# Patient Record
Sex: Male | Born: 1965 | Race: White | Hispanic: No | Marital: Single | Smoking: Current every day smoker
Health system: Southern US, Community
[De-identification: ages and names within clinical notes are randomized; demographics above are authoritative.]

## PROBLEM LIST (undated history)

## (undated) DIAGNOSIS — M609 Myositis, unspecified: Secondary | ICD-10-CM

## (undated) DIAGNOSIS — K7460 Unspecified cirrhosis of liver: Secondary | ICD-10-CM

## (undated) DIAGNOSIS — F319 Bipolar disorder, unspecified: Secondary | ICD-10-CM

## (undated) HISTORY — PX: FRACTURE SURGERY: SHX138

## (undated) HISTORY — DX: Myositis, unspecified: M60.9

## (undated) HISTORY — PX: HAND SURGERY: SHX662

---

## 2003-03-29 ENCOUNTER — Encounter: Payer: Self-pay | Admitting: *Deleted

## 2003-03-29 ENCOUNTER — Emergency Department (HOSPITAL_COMMUNITY): Admission: EM | Admit: 2003-03-29 | Discharge: 2003-03-29 | Payer: Self-pay | Admitting: *Deleted

## 2006-01-01 ENCOUNTER — Emergency Department (HOSPITAL_COMMUNITY): Admission: EM | Admit: 2006-01-01 | Discharge: 2006-01-01 | Payer: Self-pay | Admitting: Emergency Medicine

## 2009-03-03 ENCOUNTER — Emergency Department (HOSPITAL_COMMUNITY): Admission: EM | Admit: 2009-03-03 | Discharge: 2009-03-03 | Payer: Self-pay | Admitting: Emergency Medicine

## 2009-03-06 ENCOUNTER — Emergency Department (HOSPITAL_COMMUNITY): Admission: EM | Admit: 2009-03-06 | Discharge: 2009-03-06 | Payer: Self-pay | Admitting: Emergency Medicine

## 2011-02-26 LAB — BASIC METABOLIC PANEL
GFR calc Af Amer: >60 mL/min (ref >60)
GFR calc non Af Amer: >60 mL/min (ref >60)
Glucose, Bld: 88 mg/dL (ref 70–99)
Potassium: 3.3 mEq/L — ABNORMAL LOW (ref 3.5–5.1)
Sodium: 138 mEq/L (ref 135–145)

## 2011-02-26 LAB — CBC
HCT: 49.8 % (ref 39.0–52.0)
Hemoglobin: 17.5 g/dL — ABNORMAL HIGH (ref 13.0–17.0)
RBC: 5.06 MIL/uL (ref 4.22–5.81)
RDW: 13.7 % (ref 11.5–15.5)
WBC: 10 10*3/uL (ref 4.0–10.5)

## 2011-02-26 LAB — APTT: aPTT: 34 seconds (ref 24–37)

## 2012-08-13 ENCOUNTER — Emergency Department (HOSPITAL_COMMUNITY)
Admission: EM | Admit: 2012-08-13 | Discharge: 2012-08-14 | Disposition: A | Discharge status: Home / Self Care | Payer: Self-pay | Attending: Emergency Medicine | Admitting: Emergency Medicine

## 2012-08-13 ENCOUNTER — Encounter (HOSPITAL_COMMUNITY): Payer: Self-pay | Admitting: Emergency Medicine

## 2012-08-13 DIAGNOSIS — F1010 Alcohol abuse, uncomplicated: Secondary | ICD-10-CM | POA: Insufficient documentation

## 2012-08-13 HISTORY — DX: Unspecified cirrhosis of liver: K74.60

## 2012-08-13 LAB — CBC WITH DIFFERENTIAL/PLATELET
HCT: 50.9 % (ref 39.0–52.0)
Hemoglobin: 18 g/dL — ABNORMAL HIGH (ref 13.0–17.0)
Lymphocytes Relative: 27 % (ref 12–46)
Lymphs Abs: 2.1 10*3/uL (ref 0.7–4.0)
Monocytes Absolute: 0.6 10*3/uL (ref 0.1–1.0)
Monocytes Relative: 8 % (ref 3–12)
Neutro Abs: 5 10*3/uL (ref 1.7–7.7)
Neutrophils Relative %: 63 % (ref 43–77)
RBC: 5.2 MIL/uL (ref 4.22–5.81)
WBC: 7.9 10*3/uL (ref 4.0–10.5)

## 2012-08-13 LAB — COMPREHENSIVE METABOLIC PANEL
Alkaline Phosphatase: 62 U/L (ref 39–117)
BUN: 5 mg/dL — ABNORMAL LOW (ref 6–23)
CO2: 29 mEq/L (ref 19–32)
Chloride: 101 mEq/L (ref 96–112)
Creatinine, Ser: 0.45 mg/dL — ABNORMAL LOW (ref 0.50–1.35)
GFR calc non Af Amer: >90 mL/min (ref >90)
Potassium: 4.5 mEq/L (ref 3.5–5.1)
Total Bilirubin: 0.2 mg/dL — ABNORMAL LOW (ref 0.3–1.2)

## 2012-08-13 LAB — ETHANOL: Alcohol, Ethyl (B): 251 mg/dL — ABNORMAL HIGH (ref 0–11)

## 2012-08-13 NOTE — ED Notes (Signed)
Pt presents to ED via EMS stating he has alcohol poisoning and he "wants to be checked". Pt has hx of alcoholism and cirrhosis of the liver. Pt states he has drank 1 gallon of vodka today.

## 2012-08-13 NOTE — ED Provider Notes (Signed)
History   This chart was scribed for Joya Gaskins, MD by Toya Smothers. The patient was seen in room APA05/APA05. Patient's care was started at 2225.  CSN: [id number]  Arrival date & time 08/13/12  2225   First MD Initiated Contact with Patient 08/13/12 2256      Chief Complaint  Patient presents with  . Alcohol Problem   Patient is a 46 y.o. male presenting with alcohol problem. The history is provided by the patient. No language interpreter was used.  Alcohol Problem This is a chronic problem. The current episode started 6 to 12 hours ago. The problem occurs constantly. The problem has not changed since onset.Pertinent negatives include no chest pain, no abdominal pain, no headaches and no shortness of breath. The symptoms are aggravated by stress. The symptoms are relieved by drinking. He has tried water for the symptoms. The treatment provided no relief.    Bradley Mclaughlin is a 46 y.o. male with a h/o depression, chronic alcoholism, and bipolar disorder who presents to the Emergency Department complaining of severe constant alcohol problem. He endorses mild associated tremors and agitation. He has not eaten today. Pt reports drinking a gallon of vodka. Prior to arrival he drank water with no relief. Pt denies seizure, syncope, fever, chills, emesis, nausea, rash, and cough.   Past Medical History  Diagnosis Date  . Cirrhosis of liver     History reviewed. No pertinent past surgical history.  History reviewed. No pertinent family history.  History  Substance Use Topics  . Smoking status: Not on file  . Smokeless tobacco: Not on file  . Alcohol Use:    Review of Systems  Constitutional: Negative for fatigue.  HENT: Negative for congestion, sinus pressure and ear discharge.   Eyes: Negative for discharge.  Respiratory: Negative for cough and shortness of breath.   Cardiovascular: Negative for chest pain.  Gastrointestinal: Negative for vomiting, abdominal pain and  diarrhea.  Genitourinary: Negative for frequency and hematuria.  Musculoskeletal: Negative for back pain.  Skin: Negative for rash.  Neurological: Positive for tremors. Negative for seizures and headaches.  Hematological: Negative.   Psychiatric/Behavioral: Positive for agitation. Negative for hallucinations.       Alcohol problem    Allergies  Review of patient's allergies indicates not on file.  Home Medications  No current outpatient prescriptions on file.  BP 111/64  Pulse 101  Temp 97.6 F (36.4 C) (Oral)  Resp 18  SpO2 99%  Physical Exam CONSTITUTIONAL: Well developed/well nourished HEAD AND FACE: Normocephalic/atraumatic EYES: EOMI/PERRL ENMT: Mucous membranes moist NECK: supple no meningeal signs SPINE:entire spine nontender CV: S1/S2 noted, no murmurs/rubs/gallops noted LUNGS: Lungs are clear to auscultation bilaterally, no apparent distress ABDOMEN: soft, nontender, no rebound or guarding GU:no cva tenderness NEURO: Pt is awake/alert, moves all extremitiesx4.  No tremor is noted EXTREMITIES: pulses normal, full ROM SKIN: warm, color normal PSYCH: no abnormalities of mood noted  ED Course  Procedures  DIAGNOSTIC STUDIES: Oxygen Saturation is 99% on room air, normal by my interpretation.    COORDINATION OF CARE: 23:06- Evaluated Pt. Pt is awake, alert, and oriented.  No distress noted.  He is mostly concerned about restarting his lithium.  He reports he has not had it in years.  I advised him to f/u with [facility] as outpatient.  He is in no distress, watching TV and eating a meal.  Safe for d/c home, he called for ride home   Labs Reviewed  CBC WITH DIFFERENTIAL -  Abnormal; Notable for the following:    Hemoglobin 18.0 (*)     MCH 34.6 (*)     All other components within normal limits  COMPREHENSIVE METABOLIC PANEL - Abnormal; Notable for the following:    Glucose, Bld 100 (*)     BUN 5 (*)     Creatinine, Ser 0.45 (*)     Total Protein 8.6 (*)     AST  124 (*)     ALT 100 (*)     Total Bilirubin 0.2 (*)     All other components within normal limits  ETHANOL - Abnormal; Notable for the following:    Alcohol, Ethyl (B) 251 (*)     All other components within normal limits     1. Alcohol abuse       MDM  Nursing notes including past medical history and social history reviewed and considered in documentation Labs/vital reviewed and considered      I personally performed the services described in this documentation, which was scribed in my presence. The recorded information has been reviewed and considered.         Joya Gaskins, MD 08/14/12 [phone]

## 2013-09-23 ENCOUNTER — Encounter (HOSPITAL_COMMUNITY): Payer: Self-pay | Admitting: Emergency Medicine

## 2013-09-23 ENCOUNTER — Emergency Department (HOSPITAL_COMMUNITY)
Admission: EM | Admit: 2013-09-23 | Discharge: 2013-09-23 | Disposition: A | Discharge status: Home / Self Care | Payer: Self-pay | Attending: Emergency Medicine | Admitting: Emergency Medicine

## 2013-09-23 DIAGNOSIS — L509 Urticaria, unspecified: Secondary | ICD-10-CM | POA: Insufficient documentation

## 2013-09-23 DIAGNOSIS — F17200 Nicotine dependence, unspecified, uncomplicated: Secondary | ICD-10-CM | POA: Insufficient documentation

## 2013-09-23 DIAGNOSIS — Z8719 Personal history of other diseases of the digestive system: Secondary | ICD-10-CM | POA: Insufficient documentation

## 2013-09-23 MED ORDER — METHYLPREDNISOLONE SODIUM SUCC 125 MG IJ SOLR
125.0000 mg | Freq: Once | INTRAMUSCULAR | Status: AC
  Administered 2013-09-23: 125 mg via INTRAVENOUS
  Filled 2013-09-23: qty 2

## 2013-09-23 MED ORDER — PREDNISONE 20 MG PO TABS: 60.0000 mg | ORAL_TABLET | Freq: Every day | ORAL | Status: DC

## 2013-09-23 MED ORDER — FAMOTIDINE IN NACL 20-0.9 MG/50ML-% IV SOLN
20.0000 mg | Freq: Once | INTRAVENOUS | Status: AC
  Administered 2013-09-23: 20 mg via INTRAVENOUS
  Filled 2013-09-23: qty 50

## 2013-09-23 NOTE — ED Notes (Signed)
Patient reports started breaking out tonight all over body and itching. Denies using anything out of the ordinary or consuming anything out of the ordinary. He states he drinks regularly and has drank approximately 7 beers today. EMS administered 50 mg of Benadryl prior to patient's arrival.

## 2013-09-23 NOTE — ED Provider Notes (Signed)
CSN: [id number]     Arrival date & time 09/23/13  0148 History   First MD Initiated Contact with Patient 09/23/13 0153     Chief Complaint  Patient presents with  . Allergic Reaction    woke up with rash and itching   (Consider location/radiation/quality/duration/timing/severity/associated sxs/prior Treatment) Patient is a 47 y.o. male presenting with allergic reaction. The history is provided by the patient.  Allergic Reaction He noted onset this evening of itching across his lower back. He initially was breaking out in welts and that they reaction seemed to be spreading. He denies any difficulty breathing or swallowing. He tried applying Clorox and take a shower, neither of which helped. If any new medications, foods, soaps, laundry detergents. He does not recall having had reactions like this before. Of note, he admits to smoking 2.5 packs of cigarettes a day and drinking between 6 and 12 beers a day. He came in by ambulance and EMS did give her 50 mg of Benadryl which has helped with the itching.  Past Medical History  Diagnosis Date  . Cirrhosis of liver    No past surgical history on file. No family history on file. History  Substance Use Topics  . Smoking status: Current Every Day Smoker  . Smokeless tobacco: Not on file  . Alcohol Use: 4.2 oz/week    7 Cans of beer per week    Review of Systems  All other systems reviewed and are negative.    Allergies  Review of patient's allergies indicates not on file.  Home Medications  No current outpatient prescriptions on file. BP 123/87  Pulse 109  Temp(Src) 97.9 F (36.6 C) (Oral)  Resp 20  Ht 5\' 7"  (1.702 m)  Wt 111 lb (50.349 kg)  BMI 17.38 kg/m2  SpO2 96% Physical Exam  Nursing note and vitals reviewed.  47 year old male, resting comfortably and in no acute distress. Vital signs are significant for tachycardia with heart rate 109. Oxygen saturation is 96%, which is normal. Head is normocephalic and atraumatic.  PERRLA, EOMI. Oropharynx is clear. There is no edema of the uvula or soft palate or pharynx. He has no difficulty with secretions, and phonation is normal. Neck is nontender and supple without adenopathy or JVD. Back is nontender and there is no CVA tenderness. Lungs are clear without rales, wheezes, or rhonchi. Chest is nontender. Heart has regular rate and rhythm without murmur. Abdomen is soft, flat, nontender without masses or hepatosplenomegaly and peristalsis is normoactive. Extremities have no cyanosis or edema, full range of motion is present. Skin is warm and dry. Urticarial rash is present over the trunk and legs with areas of confluence. Neurologic: Mental status is normal, cranial nerves are intact, there are no motor or sensory deficits.  ED Course  Procedures (including critical care time)  MDM   1. Urticaria    Urticaria of uncertain cause. He is already received diphenhydramine. He'll be given famotidine and methylprednisolone and observed. Review of old records shows no prior ED visits for urticaria.  After above-noted treatment, rash faded dramatically. He is discharged with prescription for prednisone and is advised to use over-the-counter loratadine and famotidine. He sees diphenhydramine for itching that is not controlled by the above noted regimen.  Dione Booze, MD 09/23/13 [phone]

## 2015-03-07 ENCOUNTER — Emergency Department (HOSPITAL_COMMUNITY)
Admission: EM | Admit: 2015-03-07 | Discharge: 2015-03-07 | Disposition: A | Discharge status: Home / Self Care | Payer: Self-pay | Attending: Emergency Medicine | Admitting: Emergency Medicine

## 2015-03-07 ENCOUNTER — Encounter (HOSPITAL_COMMUNITY): Payer: Self-pay | Admitting: *Deleted

## 2015-03-07 DIAGNOSIS — K921 Melena: Secondary | ICD-10-CM

## 2015-03-07 DIAGNOSIS — R5383 Other fatigue: Secondary | ICD-10-CM | POA: Insufficient documentation

## 2015-03-07 DIAGNOSIS — Z8659 Personal history of other mental and behavioral disorders: Secondary | ICD-10-CM | POA: Insufficient documentation

## 2015-03-07 DIAGNOSIS — Z720 Tobacco use: Secondary | ICD-10-CM | POA: Insufficient documentation

## 2015-03-07 HISTORY — DX: Bipolar disorder, unspecified: F31.9

## 2015-03-07 LAB — CBC WITH DIFFERENTIAL/PLATELET
BASOS ABS: 0 10*3/uL (ref 0.0–0.1)
Basophils Relative: 0 % (ref 0–1)
Eosinophils Absolute: 0.2 10*3/uL (ref 0.0–0.7)
Eosinophils Relative: 3 % (ref 0–5)
HEMATOCRIT: 54.8 % — AB (ref 39.0–52.0)
HEMOGLOBIN: 19.7 g/dL — AB (ref 13.0–17.0)
LYMPHS PCT: 31 % (ref 12–46)
Lymphs Abs: 2.5 10*3/uL (ref 0.7–4.0)
MCH: 35.9 pg — ABNORMAL HIGH (ref 26.0–34.0)
MCHC: 35.9 g/dL (ref 30.0–36.0)
MCV: 99.8 fL (ref 78.0–100.0)
MONO ABS: 0.4 10*3/uL (ref 0.1–1.0)
MONOS PCT: 6 % (ref 3–12)
NEUTROS ABS: 4.8 10*3/uL (ref 1.7–7.7)
NEUTROS PCT: 60 % (ref 43–77)
Platelets: 225 10*3/uL (ref 150–400)
RBC: 5.49 MIL/uL (ref 4.22–5.81)
RDW: 13.4 % (ref 11.5–15.5)
WBC: 8 10*3/uL (ref 4.0–10.5)

## 2015-03-07 LAB — COMPREHENSIVE METABOLIC PANEL
ALBUMIN: 5 g/dL (ref 3.5–5.2)
ALK PHOS: 59 U/L (ref 39–117)
ALT: 90 U/L — ABNORMAL HIGH (ref 0–53)
AST: 103 U/L — AB (ref 0–37)
Anion gap: 10 (ref 5–15)
BILIRUBIN TOTAL: 0.5 mg/dL (ref 0.3–1.2)
BUN: 5 mg/dL — ABNORMAL LOW (ref 6–23)
CHLORIDE: 104 mmol/L (ref 96–112)
CO2: 26 mmol/L (ref 19–32)
Calcium: 9.5 mg/dL (ref 8.4–10.5)
Creatinine, Ser: 0.5 mg/dL (ref 0.50–1.35)
GFR calc Af Amer: >90 mL/min (ref >90)
Glucose, Bld: 97 mg/dL (ref 70–99)
POTASSIUM: 4.5 mmol/L (ref 3.5–5.1)
Sodium: 140 mmol/L (ref 135–145)
Total Protein: 9.2 g/dL — ABNORMAL HIGH (ref 6.0–8.3)

## 2015-03-07 LAB — POC OCCULT BLOOD, ED: FECAL OCCULT BLD: NEGATIVE

## 2015-03-07 NOTE — Discharge Instructions (Signed)
Bloody Stools °Bloody stools means there is blood in your poop (stool). It is a sign that there is a problem somewhere in the digestive system. It is important for your doctor to find the cause of your bleeding, so the problem can be treated.  °HOME CARE °· Only take medicine as told by your doctor. °· Eat foods with fiber (prunes, bran cereals). °· Drink enough fluids to keep your pee (urine) clear or pale yellow. °· Sit in warm water (sitz bath) for 10 to 15 minutes as told by your doctor. °· Know how to take your medicines (enemas, suppositories) if advised by your doctor. °· Watch for signs that you are getting better or getting worse. °GET HELP RIGHT AWAY IF:  °· You are not getting better. °· You start to get better but then get worse again. °· You have new problems. °· You have severe bleeding from the place where poop comes out (rectum) that does not stop. °· You throw up (vomit) blood. °· You feel weak or pass out (faint). °· You have a fever. °MAKE SURE YOU:  °· Understand these instructions. °· Will watch your condition. °· Will get help right away if you are not doing well or get worse. °Document Released: 10/22/2009 Document Revised: 01/26/2012 Document Reviewed: 03/21/2011 °[employer]® Patient Information ©2015 [employer]. This information is not intended to replace advice given to you by your health care provider. Make sure you discuss any questions you have with your health care provider. ° °

## 2015-03-07 NOTE — ED Provider Notes (Signed)
CSN: [id number]     Arrival date & time 03/07/15  0020 History   First MD Initiated Contact with Patient 03/07/15 0227     No chief complaint on file.    (Consider location/radiation/quality/duration/timing/severity/associated sxs/prior Treatment) HPI  This is a 49 year old male with history of cirrhosis of the liver, alcohol abuse who presents with bloody stools. Patient reports 2-3 month history of bloody stools. He states that it has gotten worse. He now states "it's a daily thing." He reports that there is "a lot of blood." He states that it covers the toilet. He reports generalized fatigue but no lightheadedness or dizziness. Denies any vomiting or abdominal pain. Denies any rectal pain. Has never had anything like this in the past.  Past Medical History  Diagnosis Date  . Cirrhosis of liver   . Bipolar depression    Past Surgical History  Procedure Laterality Date  . Fracture surgery      femur   No family history on file. History  Substance Use Topics  . Smoking status: Current Every Day Smoker -- 2.00 packs/day  . Smokeless tobacco: Not on file  . Alcohol Use: 4.2 oz/week    7 Cans of beer per week    Review of Systems  Constitutional: Positive for fatigue. Negative for fever.  Respiratory: Negative.  Negative for chest tightness and shortness of breath.   Cardiovascular: Negative.  Negative for chest pain.  Gastrointestinal: Positive for blood in stool. Negative for nausea, vomiting, abdominal pain, diarrhea and constipation.  Genitourinary: Negative.  Negative for dysuria.  Musculoskeletal: Negative for back pain.  Neurological: Negative for dizziness, light-headedness and headaches.  All other systems reviewed and are negative.     Allergies  Review of patient's allergies indicates no active allergies.  Home Medications   Prior to Admission medications   Medication Sig Start Date End Date Taking? Authorizing Provider  loratadine (CLARITIN) 10 MG tablet  Take 10 mg by mouth daily.   Yes Historical Provider, MD   BP 116/89 mmHg  Pulse 85  Temp(Src) 97.7 F (36.5 C) (Oral)  Resp 16  Ht  (1.702 m)  Wt 109 lb (49.442 kg)  BMI 17.07 kg/m2  SpO2 99% Physical Exam  Constitutional: He is oriented to person, place, and time. No distress.  Thin  HENT:  Head: Normocephalic and atraumatic.  Cardiovascular: Normal rate, regular rhythm and normal heart sounds.   No murmur heard. Pulmonary/Chest: Effort normal and breath sounds normal. No respiratory distress. He has no wheezes.  Abdominal: Soft. Bowel sounds are normal. There is no tenderness. There is no rebound and no guarding.  Genitourinary: Rectum normal.  No gross blood  Musculoskeletal: He exhibits no edema.  Neurological: He is alert and oriented to person, place, and time.  Skin: Skin is warm and dry.  Psychiatric: He has a normal mood and affect.  Nursing note and vitals reviewed.   ED Course  Procedures (including critical care time) Labs Review Labs Reviewed  CBC WITH DIFFERENTIAL/PLATELET - Abnormal; Notable for the following:    Hemoglobin 19.7 (*)    HCT 54.8 (*)    MCH 35.9 (*)    All other components within normal limits  COMPREHENSIVE METABOLIC PANEL - Abnormal; Notable for the following:    BUN 5 (*)    Total Protein 9.2 (*)    AST 103 (*)    ALT 90 (*)    All other components within normal limits  POC OCCULT BLOOD, ED  Imaging Review No results found.   EKG Interpretation None      MDM   Final diagnoses:  Bloody stools    Patient presents with complaints of bloody bowel movements. Nontoxic on exam. Vital signs reassuring. No gross blood on rectal examination Hemoccult at the bedside is negative. Patient denies any other symptoms.  Hemoglobin is 19.7. Otherwise lab work notable for mildly elevated AST and ALT which is likely reflective of the patient's known liver disease. No evidence of active GI bleeding at this time. Discuss workup with the  patient. Patient given the number for GI follow-up. Patient stated understanding. He was given strict return precautions.  After history, exam, and medical workup I feel the patient has been appropriately medically screened and is safe for discharge home. Pertinent diagnoses were discussed with the patient. Patient was given return precautions.     Shon Batonourtney F Aryon Nham, MD 03/07/15 [phone]

## 2015-03-07 NOTE — ED Notes (Signed)
MD at bedside. 

## 2015-03-07 NOTE — ED Notes (Signed)
Pt states for the last several months he has been passing blood that has gotten worse the last couple of days. Pt states he

## 2015-08-02 ENCOUNTER — Encounter (HOSPITAL_COMMUNITY): Payer: Self-pay | Admitting: Emergency Medicine

## 2015-08-02 ENCOUNTER — Emergency Department (HOSPITAL_COMMUNITY): Payer: Self-pay

## 2015-08-02 ENCOUNTER — Emergency Department (HOSPITAL_COMMUNITY)
Admission: EM | Admit: 2015-08-02 | Discharge: 2015-08-02 | Disposition: A | Discharge status: Home / Self Care | Payer: Self-pay | Attending: Emergency Medicine | Admitting: Emergency Medicine

## 2015-08-02 DIAGNOSIS — Z720 Tobacco use: Secondary | ICD-10-CM | POA: Insufficient documentation

## 2015-08-02 DIAGNOSIS — G8929 Other chronic pain: Secondary | ICD-10-CM | POA: Insufficient documentation

## 2015-08-02 DIAGNOSIS — Y998 Other external cause status: Secondary | ICD-10-CM | POA: Insufficient documentation

## 2015-08-02 DIAGNOSIS — Y929 Unspecified place or not applicable: Secondary | ICD-10-CM | POA: Insufficient documentation

## 2015-08-02 DIAGNOSIS — S79911A Unspecified injury of right hip, initial encounter: Secondary | ICD-10-CM | POA: Insufficient documentation

## 2015-08-02 DIAGNOSIS — Y939 Activity, unspecified: Secondary | ICD-10-CM | POA: Insufficient documentation

## 2015-08-02 DIAGNOSIS — R64 Cachexia: Secondary | ICD-10-CM | POA: Insufficient documentation

## 2015-08-02 DIAGNOSIS — M25551 Pain in right hip: Secondary | ICD-10-CM

## 2015-08-02 DIAGNOSIS — Z79899 Other long term (current) drug therapy: Secondary | ICD-10-CM | POA: Insufficient documentation

## 2015-08-02 DIAGNOSIS — W19XXXA Unspecified fall, initial encounter: Secondary | ICD-10-CM | POA: Insufficient documentation

## 2015-08-02 DIAGNOSIS — Z8659 Personal history of other mental and behavioral disorders: Secondary | ICD-10-CM | POA: Insufficient documentation

## 2015-08-02 DIAGNOSIS — Z8719 Personal history of other diseases of the digestive system: Secondary | ICD-10-CM | POA: Insufficient documentation

## 2015-08-02 MED ORDER — NAPROXEN 500 MG PO TABS: 500.0000 mg | ORAL_TABLET | Freq: Two times a day (BID) | ORAL | Status: DC

## 2015-08-02 MED ORDER — CYCLOBENZAPRINE HCL 10 MG PO TABS
5.0000 mg | ORAL_TABLET | Freq: Once | ORAL | Status: AC
  Administered 2015-08-02: 5 mg via ORAL
  Filled 2015-08-02: qty 1

## 2015-08-02 NOTE — Discharge Instructions (Signed)
You were seen today for pain in your right hip. Your pain is chronic and your x-rays are reassuring. You need to follow-up with your orthopedist who did your surgery, Dr. Hilda Lias.

## 2015-08-02 NOTE — ED Notes (Signed)
Pt c/o rt hip pain from fall 3 weeks ago. Pt has been drinking and smoking thc.

## 2015-08-02 NOTE — ED Notes (Signed)
Patient given discharge instruction, verbalized understand. Patient ambulatory out of the department.  

## 2015-08-02 NOTE — ED Provider Notes (Signed)
CSN: [id number]     Arrival date & time 08/02/15  0050 History   First MD Initiated Contact with Patient 08/02/15 0120     Chief Complaint  Patient presents with  . Hip Pain     (Consider location/radiation/quality/duration/timing/severity/associated sxs/prior Treatment) HPI  This is a 49 year old male with history of cirrhosis of the liver and alcoholism who presents with right hip pain. Patient reports history of right hip fracture requiring surgery. He states that 3 weeks ago he fell onto his right hip. Since that time he has had increasing pain. He has not taken any medications for the pain. He does self medicate with alcohol and marijuana. He drinks daily. He denies any other injury. Currently his pain is 7 out of 10. He states "I just want to know why I don't walk right."  He states that since his hip fracture he has not had a normal gait. Denies any weakness, numbness, or tingling of the lower extremity  Past Medical History  Diagnosis Date  . Cirrhosis of liver   . Bipolar depression    Past Surgical History  Procedure Laterality Date  . Fracture surgery      femur   History reviewed. No pertinent family history. Social History  Substance Use Topics  . Smoking status: Current Every Day Smoker -- 2.00 packs/day  . Smokeless tobacco: None  . Alcohol Use: 4.2 oz/week    7 Cans of beer per week    Review of Systems  Musculoskeletal: Positive for gait problem.       Right hip pain  Neurological: Negative for weakness and numbness.  All other systems reviewed and are negative.     Allergies  Review of patient's allergies indicates no active allergies.  Home Medications   Prior to Admission medications   Medication Sig Start Date End Date Taking? Authorizing Provider  loratadine (CLARITIN) 10 MG tablet Take 10 mg by mouth daily.    Historical Provider, MD  naproxen (NAPROSYN) 500 MG tablet Take 1 tablet (500 mg total) by mouth 2 (two) times daily. 08/02/15   Shon Baton, MD   BP 123/89 mmHg  Pulse 105  Temp(Src) 98.1 F (36.7 C)  Resp 18  Ht 5\' 7"  (1.702 m)  Wt 112 lb (50.803 kg)  BMI 17.54 kg/m2  SpO2 96% Physical Exam  Constitutional: He is oriented to person, place, and time.  Cachectic, chronically ill-appearing  HENT:  Head: Normocephalic and atraumatic.  Cardiovascular: Normal rate and regular rhythm.   Pulmonary/Chest: Effort normal.  Musculoskeletal: Normal range of motion. He exhibits no edema.  Normal range of motion of the right hip, no obvious contusion or abrasion, no obvious deformities, 2+ DP pulses  Neurological: He is alert and oriented to person, place, and time.  Skin: Skin is warm and dry.  Psychiatric: He has a normal mood and affect.  Nursing note and vitals reviewed.   ED Course  Procedures (including critical care time) Labs Review Labs Reviewed - No data to display  Imaging Review Dg Hip Unilat With Pelvis 2-3 Views Right  08/02/2015   CLINICAL DATA:  49 year old male with hip pain. History of prior right hip surgery.  EXAM: DG HIP (WITH OR WITHOUT PELVIS) 2-3V RIGHT  COMPARISON:  None.  FINDINGS: No acute fracture or dislocation. Minimal bowed appearance of the proximal right femoral diaphysis, likely related to old healed fracture. Small corticated appearing bony fragment noted adjacent to the greater trochanter of the right femur, likely related to  an old fracture. The soft tissues are grossly unremarkable.  IMPRESSION: No acute fracture or dislocation.   Electronically Signed   By: Elgie Collard M.D.   On: 08/02/2015 02:15   I have personally reviewed and evaluated these images and lab results as part of my medical decision-making.   EKG Interpretation None      MDM   Final diagnoses:  Chronic hip pain, right    Patient presents with right hip pain. History of the same. Does report a fall 3 weeks ago. Has been ambulatory. Otherwise nontoxic. Patient is declining pain medication and states "I  just want to know why I can't walk right." Plain films are negative. Discussed with patient at length following up with his orthopedist. He states that Dr. Hilda Lias did his original surgery. I do not see anything in his chart indicating this surgery; however have encouraged him to follow-up. He is requesting muscle relaxers. I discussed with him that I did not feel this was appropriate as he drink alcohol daily and is a fall risk. He can take naproxen twice a day for pain.  After history, exam, and medical workup I feel the patient has been appropriately medically screened and is safe for discharge home. Pertinent diagnoses were discussed with the patient. Patient was given return precautions.     Shon Baton, MD 08/02/15 [phone]

## 2016-11-29 ENCOUNTER — Encounter (HOSPITAL_COMMUNITY): Payer: Self-pay | Admitting: Emergency Medicine

## 2016-11-29 ENCOUNTER — Emergency Department (HOSPITAL_COMMUNITY): Payer: Self-pay

## 2016-11-29 ENCOUNTER — Emergency Department (HOSPITAL_COMMUNITY)
Admission: EM | Admit: 2016-11-29 | Discharge: 2016-11-29 | Disposition: A | Discharge status: Home / Self Care | Payer: Self-pay | Attending: Emergency Medicine | Admitting: Emergency Medicine

## 2016-11-29 DIAGNOSIS — Z79899 Other long term (current) drug therapy: Secondary | ICD-10-CM | POA: Insufficient documentation

## 2016-11-29 DIAGNOSIS — T22111A Burn of first degree of right forearm, initial encounter: Secondary | ICD-10-CM | POA: Insufficient documentation

## 2016-11-29 DIAGNOSIS — T300 Burn of unspecified body region, unspecified degree: Secondary | ICD-10-CM

## 2016-11-29 DIAGNOSIS — J441 Chronic obstructive pulmonary disease with (acute) exacerbation: Secondary | ICD-10-CM | POA: Insufficient documentation

## 2016-11-29 DIAGNOSIS — Y929 Unspecified place or not applicable: Secondary | ICD-10-CM | POA: Insufficient documentation

## 2016-11-29 DIAGNOSIS — Y9389 Activity, other specified: Secondary | ICD-10-CM | POA: Insufficient documentation

## 2016-11-29 DIAGNOSIS — F17200 Nicotine dependence, unspecified, uncomplicated: Secondary | ICD-10-CM | POA: Insufficient documentation

## 2016-11-29 DIAGNOSIS — Y999 Unspecified external cause status: Secondary | ICD-10-CM | POA: Insufficient documentation

## 2016-11-29 DIAGNOSIS — X158XXA Contact with other hot household appliances, initial encounter: Secondary | ICD-10-CM | POA: Insufficient documentation

## 2016-11-29 MED ORDER — ALBUTEROL SULFATE HFA 108 (90 BASE) MCG/ACT IN AERS
2.0000 | INHALATION_SPRAY | Freq: Four times a day (QID) | RESPIRATORY_TRACT | Status: DC | PRN
Start: 2016-11-29 — End: 2016-11-29
  Administered 2016-11-29: 2 via RESPIRATORY_TRACT
  Filled 2016-11-29: qty 6.7

## 2016-11-29 MED ORDER — SILVER SULFADIAZINE 1 % EX CREA
TOPICAL_CREAM | Freq: Once | CUTANEOUS | Status: DC
  Filled 2016-11-29: qty 50

## 2016-11-29 MED ORDER — IPRATROPIUM BROMIDE 0.02 % IN SOLN
0.5000 mg | Freq: Once | RESPIRATORY_TRACT | Status: AC
  Administered 2016-11-29: 0.5 mg via RESPIRATORY_TRACT
  Filled 2016-11-29: qty 2.5

## 2016-11-29 MED ORDER — AZITHROMYCIN 250 MG PO TABS: ORAL_TABLET | ORAL | 0 refills | Status: DC

## 2016-11-29 MED ORDER — ALBUTEROL SULFATE (2.5 MG/3ML) 0.083% IN NEBU
5.0000 mg | INHALATION_SOLUTION | Freq: Once | RESPIRATORY_TRACT | Status: AC
  Administered 2016-11-29: 5 mg via RESPIRATORY_TRACT
  Filled 2016-11-29: qty 6

## 2016-11-29 MED ORDER — AEROCHAMBER Z-STAT PLUS/MEDIUM MISC
1.0000 | Freq: Once | Status: DC
  Filled 2016-11-29: qty 1

## 2016-11-29 MED ORDER — PREDNISONE 20 MG PO TABS: ORAL_TABLET | ORAL | 0 refills | Status: DC

## 2016-11-29 NOTE — Discharge Instructions (Signed)
Use the inhaler 2 puffs every 6 hours for wheezing and shortness of breath. Take the prednisone and antibiotic until gone. Try to quit smoking. You already have changes of emphysema or COPD. Recheck if you get worse, such as fever, struggling to breathe. You can take mucinex DM for cough.

## 2016-11-29 NOTE — ED Triage Notes (Signed)
Pt C/O cough X 2 weeks. Pt states he has "taken 3 boxes of cold medicine over 2 weeks with no relief."
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
## 2020-07-05 ENCOUNTER — Ambulatory Visit: Payer: Self-pay | Attending: Internal Medicine

## 2020-07-05 DIAGNOSIS — Z23 Encounter for immunization: Secondary | ICD-10-CM

## 2020-07-05 NOTE — Progress Notes (Signed)
   Covid-19 Vaccination Clinic  Name:  Bradley Mclaughlin    MRN: [id number] DOB: [birth year]  07/05/2020  Mr. Bradley Mclaughlin was observed post Covid-19 immunization for 15 minutes without incident. He was provided with Vaccine Information Sheet and instruction to access the V-Safe system.   Mr. Bradley Mclaughlin was instructed to call 911 with any severe reactions post vaccine: Marland Kitchen Difficulty breathing  . Swelling of face and throat  . A fast heartbeat  . A bad rash all over body  . Dizziness and weakness   Immunizations Administered    Name Date Dose VIS Date Route   Pfizer COVID-19 Vaccine 07/05/2020  9:12 AM 0.3 mL 01/11/2019 Intramuscular   Manufacturer: [employer], [employer]   Lot: [id number]   NDC: [address]-7

## 2020-07-26 ENCOUNTER — Ambulatory Visit: Payer: Self-pay | Attending: Internal Medicine

## 2020-07-26 DIAGNOSIS — Z23 Encounter for immunization: Secondary | ICD-10-CM

## 2020-07-26 NOTE — Progress Notes (Signed)
   Covid-19 Vaccination Clinic  Name:  Bradley Mclaughlin    MRN: [id number] DOB: 06/18/1966  07/26/2020  Mr. Bradley Mclaughlin was observed post Covid-19 immunization for 15 minutes without incident. He was provided with Vaccine Information Sheet and instruction to access the V-Safe system.   Mr. Bradley Mclaughlin was instructed to call 911 with any severe reactions post vaccine: Marland Kitchen Difficulty breathing  . Swelling of face and throat  . A fast heartbeat  . A bad rash all over body  . Dizziness and weakness   Immunizations Administered    Name Date Dose VIS Date Route   Pfizer COVID-19 Vaccine 07/26/2020  9:23 AM 0.3 mL 01/11/2019 Intramuscular   Manufacturer: [employer], [employer]   Lot: [id number]   NDC: [address]-4

## 2021-02-21 ENCOUNTER — Telehealth: Payer: Self-pay

## 2021-02-21 NOTE — Telephone Encounter (Signed)
New [facility] client. Attempted to call to assist client in setting up [facility] appointment [facility] to [facility] care. No answer. Left message.   Francee Nodal RN [employer]

## 2021-02-21 NOTE — Telephone Encounter (Signed)
Client new to [facility]: returned call.  Assisted in setting up appointment to establish [facility]  Date 03/05/21 time 1PM  Client given contact information.  Client reports he will need transportation. [facility] arranged today for 03/05/21 for [facility] appointment. Pick up address [address], [place], [place] [address].  Client wishes to leave [place] Dad address as primary mailing address.  Client notified of when [facility] will contact client to confirm pick up time and location on 03/04/21. [facility] [employer] will obtain transportation [employer].  Will plan follow up after first appointment.  Francee Nodal RN [employer]

## 2021-03-05 ENCOUNTER — Ambulatory Visit: Payer: Self-pay | Admitting: Physician Assistant

## 2021-03-05 ENCOUNTER — Encounter: Payer: Self-pay | Admitting: Physician Assistant

## 2021-03-05 VITALS — BP 128/77 | HR 79 | Temp 97.0°F | Wt 114.0 lb

## 2021-03-05 DIAGNOSIS — F17200 Nicotine dependence, unspecified, uncomplicated: Secondary | ICD-10-CM

## 2021-03-05 DIAGNOSIS — Z9889 Other specified postprocedural states: Secondary | ICD-10-CM

## 2021-03-05 DIAGNOSIS — Z7689 Persons encountering health services in other specified circumstances: Secondary | ICD-10-CM

## 2021-03-05 DIAGNOSIS — Z13220 Encounter for screening for lipoid disorders: Secondary | ICD-10-CM

## 2021-03-05 DIAGNOSIS — R7989 Other specified abnormal findings of blood chemistry: Secondary | ICD-10-CM

## 2021-03-05 DIAGNOSIS — M25551 Pain in right hip: Secondary | ICD-10-CM

## 2021-03-05 DIAGNOSIS — Z125 Encounter for screening for malignant neoplasm of prostate: Secondary | ICD-10-CM

## 2021-03-05 DIAGNOSIS — Z1211 Encounter for screening for malignant neoplasm of colon: Secondary | ICD-10-CM

## 2021-03-05 DIAGNOSIS — F1010 Alcohol abuse, uncomplicated: Secondary | ICD-10-CM
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
## 2021-03-05 NOTE — Patient Instructions (Signed)
  Xray right hip  Refer to orthopedics Financial assistance application Labs AA [facility] covid booster Colon cancer screening test

## 2021-03-06 ENCOUNTER — Other Ambulatory Visit: Payer: Self-pay | Admitting: Physician Assistant

## 2021-03-06 ENCOUNTER — Telehealth: Payer: Self-pay

## 2021-03-06 DIAGNOSIS — Z1211 Encounter for screening for malignant neoplasm of colon: Secondary | ICD-10-CM

## 2021-03-06 NOTE — Telephone Encounter (Signed)
Called to follow up with Mr Cuevas after his [facility] appointment [facility]. Client reports appointment went well. Provider referring client to orthopedics for hip and back pain. He is also ordered, x-rays and labs at [facility]. * arranged [facility] [employer] for 03/08/21 at 0830 to get labs drawn and x-ray performed.  * arranged [facility] for [facility] in intake at [facility] for [facility] health services to be established for 03/12/21 at 0800.  * Arranged [facility] for follow up visit to [facility] for 04/04/21 at 0815.  Discussed CAFA application will discuss with Abran Duke, client has application to fill out, but will need docs and notarized letter.  Discussed housing options and will begin seeking options for client. Client understands housing options are a process and may take some time.  Will follow as needed.  Francee Nodal RN [employer]

## 2021-03-08 ENCOUNTER — Ambulatory Visit (HOSPITAL_COMMUNITY)
Admission: RE | Admit: 2021-03-08 | Discharge: 2021-03-08 | Disposition: A | Discharge status: Home / Self Care | Payer: Self-pay | Source: Ambulatory Visit | Attending: Physician Assistant | Admitting: Physician Assistant

## 2021-03-08 ENCOUNTER — Other Ambulatory Visit: Payer: Self-pay | Admitting: Physician Assistant

## 2021-03-08 ENCOUNTER — Other Ambulatory Visit (HOSPITAL_COMMUNITY)
Admission: RE | Admit: 2021-03-08 | Discharge: 2021-03-08 | Disposition: A | Discharge status: Home / Self Care | Payer: Self-pay | Source: Ambulatory Visit | Attending: Physician Assistant | Admitting: Physician Assistant

## 2021-03-08 ENCOUNTER — Other Ambulatory Visit: Payer: Self-pay

## 2021-03-08 DIAGNOSIS — F1010 Alcohol abuse, uncomplicated: Secondary | ICD-10-CM | POA: Insufficient documentation

## 2021-03-08 DIAGNOSIS — R7989 Other specified abnormal findings of blood chemistry: Secondary | ICD-10-CM | POA: Insufficient documentation

## 2021-03-08 DIAGNOSIS — Z9889 Other specified postprocedural states: Secondary | ICD-10-CM | POA: Insufficient documentation

## 2021-03-08 DIAGNOSIS — Z13220 Encounter for screening for lipoid disorders: Secondary | ICD-10-CM | POA: Insufficient documentation

## 2021-03-08 DIAGNOSIS — M25551 Pain in right hip: Secondary | ICD-10-CM

## 2021-03-08 DIAGNOSIS — Z125 Encounter for screening for malignant neoplasm of prostate: Secondary | ICD-10-CM | POA: Insufficient documentation

## 2021-03-08 LAB — COMPREHENSIVE METABOLIC PANEL
ALT: 96 U/L — ABNORMAL HIGH (ref 0–44)
AST: 148 U/L — ABNORMAL HIGH (ref 15–41)
Albumin: 4.1 g/dL (ref 3.5–5.0)
Alkaline Phosphatase: 49 U/L (ref 38–126)
Anion gap: 10 (ref 5–15)
BUN: 14 mg/dL (ref 6–20)
CO2: 26 mmol/L (ref 22–32)
Calcium: 8.8 mg/dL — ABNORMAL LOW (ref 8.9–10.3)
Chloride: 105 mmol/L (ref 98–111)
Creatinine, Ser: 0.43 mg/dL — ABNORMAL LOW (ref 0.61–1.24)
GFR, Estimated: >60 mL/min (ref >60)
Glucose, Bld: 101 mg/dL — ABNORMAL HIGH (ref 70–99)
Potassium: 4 mmol/L (ref 3.5–5.1)
Sodium: 141 mmol/L (ref 135–145)
Total Bilirubin: 0.6 mg/dL (ref 0.3–1.2)
Total Protein: 7.5 g/dL (ref 6.5–8.1)

## 2021-03-08 LAB — LIPID PANEL
Cholesterol: 178 mg/dL (ref 0–200)
HDL: 66 mg/dL (ref >40)
LDL Cholesterol: 90 mg/dL (ref 0–99)
Total CHOL/HDL Ratio: 2.7 RATIO
Triglycerides: 111 mg/dL (ref <150)
VLDL: 22 mg/dL (ref 0–40)

## 2021-03-08 LAB — CBC
HCT: 48.3 % (ref 39.0–52.0)
Hemoglobin: 16.3 g/dL (ref 13.0–17.0)
MCH: 34 pg (ref 26.0–34.0)
MCHC: 33.7 g/dL (ref 30.0–36.0)
MCV: 100.6 fL — ABNORMAL HIGH (ref 80.0–100.0)
Platelets: 219 10*3/uL (ref 150–400)
RBC: 4.8 MIL/uL (ref 4.22–5.81)
RDW: 13.4 % (ref 11.5–15.5)
WBC: 5.5 10*3/uL (ref 4.0–10.5)
nRBC: 0 % (ref 0.0–0.2)

## 2021-03-08 LAB — PSA: Prostatic Specific Antigen: 0.49 ng/mL (ref 0.00–4.00)

## 2021-03-19 ENCOUNTER — Telehealth: Payer: Self-pay

## 2021-03-19 NOTE — Telephone Encounter (Signed)
Returned call to client. He needs to return to [facility] to complete his intake. Will arrange transportation for 03/21/21 at 0800 with [facility] [employer].  Client inquiring about CAFA. Emailed D. Leavy Cella to ask for follow up.   Francee Nodal RN [employer]

## 2021-03-21 ENCOUNTER — Telehealth: Payer: Self-pay

## 2021-03-21 NOTE — Telephone Encounter (Signed)
Called client as there was some confusion regarding [facility] appointment to complete intake. Called [facility] for 03/25/21 at 0800 to [facility] . Also confirmed transportation is scheduled for 04/04/21 at 0815 to [facility] for a follow up appointment.   Will continue to follow as needed.   Francee Nodal RN [employer]

## 2021-04-04 ENCOUNTER — Ambulatory Visit: Payer: Self-pay | Admitting: Physician Assistant

## 2021-04-04 ENCOUNTER — Encounter: Payer: Self-pay | Admitting: Physician Assistant

## 2021-04-04 ENCOUNTER — Telehealth: Payer: Self-pay

## 2021-04-04 DIAGNOSIS — Z9889 Other specified postprocedural states: Secondary | ICD-10-CM

## 2021-04-04 DIAGNOSIS — M545 Low back pain, unspecified: Secondary | ICD-10-CM

## 2021-04-04 DIAGNOSIS — M25551 Pain in right hip: Secondary | ICD-10-CM

## 2021-04-04 DIAGNOSIS — R7989 Other specified abnormal findings of blood chemistry: Secondary | ICD-10-CM

## 2021-04-04 DIAGNOSIS — F1010 Alcohol abuse, uncomplicated: Secondary | ICD-10-CM

## 2021-04-04 DIAGNOSIS — F17200 Nicotine dependence, unspecified, uncomplicated: Secondary | ICD-10-CM

## 2021-04-04 NOTE — Telephone Encounter (Signed)
Received email from provider at [facility] and call from client that [facility] failed to pick up client for his 0815 appointment. Transportation scheduled on 03/06/21 by this RN for his appointment 04/04/21.  Called [facility] [employer] to determine what happened. [facility] determined client was scheduled in error for the wrong date.  Client called and notified of issue.  Discussed with client about issue with his phone, he states he has a relative coming in from out of town to help him check his phone.  Update: 04/04/21 1534 Client reports his family was able to change the settings on his phone and now he has access to his AA visit online as well as future video visits with primary care as needed.  Francee Nodal RN [employer]

## 2021-04-04 NOTE — Progress Notes (Signed)
There were no vitals taken for this visit.   Subjective:    Patient ID: Bradley Mclaughlin, male    DOB: February 02, 1966, 55 y.o.   MRN: [id number]  HPI: Bradley Mclaughlin is a 55 y.o. male presenting on 04/04/2021 for No chief complaint on file.   HPI  This is a telemedicine appointment due to coronavirus pandemic.  Pt was offered in person appointment but chose the virtual option.  It was via telephone as he was unable to get video connected through [employer].  I connected with  Tyler Aas on 04/04/21 by a video enabled telemedicine application and verified that I am speaking with the correct person using two identifiers.   I discussed the limitations of evaluation and management by telemedicine. The patient expressed understanding and agreed to proceed.  Pt is at home.  Provider is at office.    Pt is 54yoM with follow up to his new patient appointment last month.    He is Going to [employer].  He has appointment to follow up there in august  He is Trying to get on virtual AA meetings since he has transportation problems for in-person meetings but has been unable to connect due to technical issues.  He says he Got approved for [facility]ist  He says his Hip and back pain are worsening  He submitted his [facility]a/[facility] financial assistance application  to [facility]  He had arranged for transportation for his appointment this morning that didn't arrive      Relevant past medical, surgical, family and social history reviewed and updated as indicated. Interim medical history since our last visit reviewed. Allergies and medications reviewed and updated.    Current Outpatient Medications:  .  doxepin (SINEQUAN) 100 MG capsule, Take 100 mg by mouth., Disp: , Rfl:  .  loratadine (CLARITIN) 10 MG tablet, Take 10 mg by mouth daily., Disp: , Rfl:  .  Multiple Vitamins-Minerals (CENTRUM SILVER PO), Take by mouth., Disp: , Rfl:      Review of Systems  Per HPI unless specifically  indicated above     Objective:    There were no vitals taken for this visit.  Wt Readings from Last 3 Encounters:  03/05/21 114 lb (51.7 kg)  11/29/16 110 lb (49.9 kg)  08/02/15 112 lb (50.8 kg)    Physical Exam Pulmonary:     Effort: No respiratory distress.     Comments: Pt is speaking in complete sentences without dyspnea Neurological:     Mental Status: He is alert and oriented to person, place, and time.  Psychiatric:        Attention and Perception: Attention normal.        Speech: Speech normal.        Behavior: Behavior is cooperative.     Results for orders placed or performed during the hospital encounter of 03/08/21  PSA  Result Value Ref Range   Prostatic Specific Antigen 0.49 0.00 - 4.00 ng/mL  CBC  Result Value Ref Range   WBC 5.5 4.0 - 10.5 K/uL   RBC 4.80 4.22 - 5.81 MIL/uL   Hemoglobin 16.3 13.0 - 17.0 g/dL   HCT 17.4 08.1 - 44.8 %   MCV 100.6 (H) 80.0 - 100.0 fL   MCH 34.0 26.0 - 34.0 pg   MCHC 33.7 30.0 - 36.0 g/dL   RDW 18.5 63.1 - 49.7 %   Platelets 219 150 - 400 K/uL   nRBC 0.0 0.0 - 0.2 %  Comprehensive metabolic panel  Result Value Ref Range   Sodium 141 135 - 145 mmol/L   Potassium 4.0 3.5 - 5.1 mmol/L   Chloride 105 98 - 111 mmol/L   CO2 26 22 - 32 mmol/L   Glucose, Bld 101 (H) 70 - 99 mg/dL   BUN 14 6 - 20 mg/dL   Creatinine, Ser 9.44 (L) 0.61 - 1.24 mg/dL   Calcium 8.8 (L) 8.9 - 10.3 mg/dL   Total Protein 7.5 6.5 - 8.1 g/dL   Albumin 4.1 3.5 - 5.0 g/dL   AST 967 (H) 15 - 41 U/L   ALT 96 (H) 0 - 44 U/L   Alkaline Phosphatase 49 38 - 126 U/L   Total Bilirubin 0.6 0.3 - 1.2 mg/dL   GFR, Estimated >59 >16 mL/min   Anion gap 10 5 - 15  Lipid panel  Result Value Ref Range   Cholesterol 178 0 - 200 mg/dL   Triglycerides 384 <665 mg/dL   HDL 66 >99 mg/dL   Total CHOL/HDL Ratio 2.7 RATIO   VLDL 22 0 - 40 mg/dL   LDL Cholesterol 90 0 - 99 mg/dL        Assessment & Plan:    Encounter Diagnoses  Name Primary?  . Right hip pain  Yes  . History of hip surgery   . Chronic low back pain, unspecified back pain laterality, unspecified whether sciatica present   . Alcohol abuse   . Tobacco use disorder   . Elevated LFTs       -Reviewed labs and xray hip with pt.   -Refer to orthopedics as discussed at April 19 appointment -will Check on cafa/[facility] financial assistance application -pt encouraged to Work on [employer] and stopping alcohol -Pt is reminded to return FIT test for colon cancer screening  -pt to follow up 3 or 4 months.  He is to contact office sooner prn

## 2021-04-17 ENCOUNTER — Telehealth: Payer: Self-pay

## 2021-04-17 NOTE — Telephone Encounter (Signed)
Client called requesting transportation to his [place] [facility] office scheduled for 04/23/21 at 9am. . [facility] called and arranged transportation.  Client also states he has been approved for CAFA and will take letter to ortho appointment.  Client reports prescription for Sinequan from [facility] after taking his face swelled, but unsure if it was dental or medication. Suggested client call [facility] to discuss. Also Sinequan is not available with [facility] or [facility] pharmacy. Recommended client also ask provider regarding possible change of medication due to financial need. [facility] provided [facility] for last fill of medication that was 4$ and client reports he has 6$ left on card that he can use for refill if he is told to continue medication.  He reports no further swelling, however, he did stop taking the medication. He does report a toothache also at the time of the swelling.   Client to call for any further needs.  Francee Nodal RN [employer]

## 2021-04-23 ENCOUNTER — Telehealth: Payer: Self-pay

## 2021-04-23 ENCOUNTER — Ambulatory Visit: Payer: Self-pay | Admitting: Orthopaedic Surgery

## 2021-04-23 NOTE — Telephone Encounter (Signed)
Client called and left message that transportation that had been arranged for his Ortho appointment today on 04/17/21 with [facility] by this RN. He was not picked up and had to reschedule.  Called [employer] and spoke to [place] and rescheduled client for 04/30/21 to arrive at 0830am. Christian with [facility] will call to follow up with why client was not picked up today.

## 2021-04-30 ENCOUNTER — Other Ambulatory Visit: Payer: Self-pay

## 2021-04-30 ENCOUNTER — Encounter: Payer: Self-pay | Admitting: Orthopaedic Surgery

## 2021-04-30 ENCOUNTER — Telehealth: Payer: Self-pay

## 2021-04-30 ENCOUNTER — Ambulatory Visit: Payer: Self-pay

## 2021-04-30 ENCOUNTER — Ambulatory Visit (INDEPENDENT_AMBULATORY_CARE_PROVIDER_SITE_OTHER): Payer: Self-pay | Admitting: Orthopaedic Surgery

## 2021-04-30 VITALS — BP 126/81 | HR 67 | Ht 67.0 in | Wt 105.8 lb

## 2021-04-30 DIAGNOSIS — G8929 Other chronic pain: Secondary | ICD-10-CM

## 2021-04-30 DIAGNOSIS — M545 Low back pain, unspecified: Secondary | ICD-10-CM

## 2021-04-30 NOTE — Telephone Encounter (Signed)
Called client to follow up regarding cane need. No answer, left voicemail requesting return call.   Francee Nodal RN [employer]

## 2021-04-30 NOTE — Progress Notes (Signed)
Subjective:    Patient ID: Bradley Mclaughlin, male    DOB: Aug 08, 1966, 55 y.o.   MRN: [id number]  HPI He complains of hip pain and lower back pain.  When he was 15, he was admitted for right femur fracture and he had skeletal traction applied.  He says he hurt his hip but it was his femur.  He has been to ER for right hip pain.  X-rays were negative on 03-08-21.    I have independently reviewed and interpreted x-rays of this patient done at another site by another physician or qualified health professional.  He is followed by the [facility].  His lower back pain has been present for some time.  He has no trauma, no weakness but he has pain in ROM and standing for periods of time.   Review of Systems  Constitutional:  Positive for activity change.  Musculoskeletal:  Positive for arthralgias and back pain.  Psychiatric/Behavioral:         History of alcohol abuse  All other systems reviewed and are negative. For Review of Systems, all other systems reviewed and are negative.  The following is a summary of the past history medically, past history surgically, known current medicines, social history and family history.  This information is gathered electronically by the computer from prior information and documentation.  I review this each visit and have found including this information at this point in the chart is beneficial and informative.   Past Medical History:  Diagnosis Date   Bipolar depression (HCC)    Cirrhosis of liver (HCC)     Past Surgical History:  Procedure Laterality Date   FRACTURE SURGERY     femur   HAND SURGERY Right     Current Outpatient Medications on File Prior to Visit  Medication Sig Dispense Refill   doxepin (SINEQUAN) 100 MG capsule Take 100 mg by mouth.     loratadine (CLARITIN) 10 MG tablet Take 10 mg by mouth daily.     Multiple Vitamins-Minerals (CENTRUM SILVER PO) Take by mouth.     traZODone (DESYREL) 100 MG tablet Take 100 mg by mouth at  bedtime.     No current facility-administered medications on file prior to visit.    Social History   Socioeconomic History   Marital status: Single    Spouse name: Not on file   Number of children: Not on file   Years of education: Not on file   Highest education level: Not on file  Occupational History   Not on file  Tobacco Use   Smoking status: Every Day    Packs/day: 1.00    Pack years: 0.00    Types: Cigarettes   Smokeless tobacco: Never   Tobacco comments:    previously smoking 2 ppd  Vaping Use   Vaping Use: Never used  Substance and Sexual Activity   Alcohol use: Yes    Alcohol/week: 7.0 standard drinks    Types: 7 Cans of beer per week    Comment: usually 1/2 - 1 case a day   Drug use: Yes    Types: Marijuana   Sexual activity: Yes  Other Topics Concern   Not on file  Social History Narrative   Not on file   Social Determinants of Health   Financial Resource Strain: Not on file  Food Insecurity: Not on file  Transportation Needs: Not on file  Physical Activity: Not on file  Stress: Not on file  Social Connections: Not  on file  Intimate Partner Violence: Not on file    Family History  Problem Relation Age of Onset   Cancer Mother     BP 126/81   Pulse 67   Ht 5\' 7"  (1.702 m)   Wt 105 lb 12.8 oz (48 kg)   BMI 16.57 kg/m   Body mass index is 16.57 kg/m.     Objective:   Physical Exam Vitals and nursing note reviewed. Exam conducted with a chaperone present.  Constitutional:      Appearance: He is well-developed.  HENT:     Head: Normocephalic and atraumatic.  Eyes:     Conjunctiva/sclera: Conjunctivae normal.     Pupils: Pupils are equal, round, and reactive to light.  Cardiovascular:     Rate and Rhythm: Normal rate and regular rhythm.  Pulmonary:     Effort: Pulmonary effort is normal.  Abdominal:     Palpations: Abdomen is soft.  Musculoskeletal:       Arms:     Cervical back: Normal range of motion and neck supple.   Skin:    General: Skin is warm and dry.  Neurological:     Mental Status: He is alert and oriented to person, place, and time.     Cranial Nerves: No cranial nerve deficit.     Motor: No abnormal muscle tone.     Coordination: Coordination normal.     Deep Tendon Reflexes: Reflexes are normal and symmetric. Reflexes normal.  Psychiatric:        Behavior: Behavior normal.        Thought Content: Thought content normal.        Judgment: Judgment normal.  X-rays were done of the lumbar spine, reported separately.        Assessment & Plan:   Encounter Diagnosis  Name Primary?   Chronic right-sided low back pain without sciatica Yes   I would like to get MRI of the lumbar spine.  Return after MRI in one month.  Call if any problem.  Precautions discussed.  Electronically Signed , MD 6/14/202210:27 AM

## 2021-04-30 NOTE — Telephone Encounter (Signed)
Client returned call. Client was seen by orthopedist today and was given a prescription for a cane. ( No specific type of cane) He has been using a regular cane. Will reach out to [facility] in an attempt to assist with finding client a cane.  Will also arrange transportation for 05/10/21 to [facility] for MRI ordered. Client to arrive at 0730am.   Plan: will contact client when we have the cane for him and assist in getting the cane to client as he has no transportation. Client agreeable.  Francee Nodal RN [employer]

## 2021-05-02 ENCOUNTER — Telehealth: Payer: Self-pay

## 2021-05-02 NOTE — Telephone Encounter (Signed)
Returned client's call. Client is needing to have trazodone filled. Reviewed and called [facility] and medication is available. Discussed that [facility] provider would need to send to clients prescription there. Client has called and left a message and is awaiting return call. Meanwhile [facility] will provide him with a [facility] giftcard to get his medications filled. Will plan for staff to deliver card while delivering cane.  Francee Nodal RN [employer]

## 2021-05-02 NOTE — Telephone Encounter (Signed)
Client called to inform him that we were able to obtain a cane via [facility] and that we will plan to deliver to him as soon as possible by our staff.  Client thankful and no other needs at this time.  Francee Nodal RN Clara [facility]

## 2021-05-10 ENCOUNTER — Other Ambulatory Visit: Payer: Self-pay

## 2021-05-10 ENCOUNTER — Ambulatory Visit (HOSPITAL_COMMUNITY)
Admission: RE | Admit: 2021-05-10 | Discharge: 2021-05-10 | Disposition: A | Discharge status: Home / Self Care | Payer: Self-pay | Source: Ambulatory Visit | Attending: Orthopaedic Surgery | Admitting: Orthopaedic Surgery

## 2021-05-10 ENCOUNTER — Telehealth: Payer: Self-pay

## 2021-05-10 DIAGNOSIS — G8929 Other chronic pain: Secondary | ICD-10-CM | POA: Insufficient documentation

## 2021-05-10 DIAGNOSIS — M545 Low back pain, unspecified: Secondary | ICD-10-CM | POA: Insufficient documentation

## 2021-05-10 NOTE — Telephone Encounter (Signed)
Client had called [facility] main number this am regarding unsure if he had transportation. Returned client's call. Client states [facility] did pick him and he has completed his MRI for ortho.  Discussed again client notifying [facility] that per [facility] pharmacy they does carry trazodone.   Client states understanding. Currently client has medication.  Francee Nodal RN [employer]

## 2021-05-28 ENCOUNTER — Other Ambulatory Visit: Payer: Self-pay

## 2021-05-28 ENCOUNTER — Ambulatory Visit (INDEPENDENT_AMBULATORY_CARE_PROVIDER_SITE_OTHER): Payer: Self-pay | Admitting: Orthopaedic Surgery

## 2021-05-28 ENCOUNTER — Encounter: Payer: Self-pay | Admitting: Orthopaedic Surgery

## 2021-05-28 VITALS — BP 125/80 | HR 68 | Ht 67.0 in | Wt 105.5 lb

## 2021-05-28 DIAGNOSIS — M545 Low back pain, unspecified: Secondary | ICD-10-CM

## 2021-05-28 DIAGNOSIS — G8929 Other chronic pain: Secondary | ICD-10-CM

## 2021-05-28 MED ORDER — HYDROCODONE-ACETAMINOPHEN 5-325 MG PO TABS: 1.0000 | ORAL_TABLET | ORAL | 0 refills | Status: AC | PRN

## 2021-05-28 NOTE — Progress Notes (Signed)
My back still hurts.  He had the MRI of the lumbar spine showing:  IMPRESSION: 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 results in mild compression of the thecal sac. Otherwise unremarkable lumbar spine MRI.    I have explained the findings to him.  This usually does not need any procedure but he would like to see a neurosurgeon.  I will make the appointment.  He has lower back pain.  No spasm.  ROM is good.  NV intact.  He uses a cane.  Encounter Diagnosis  Name Primary?   Chronic right-sided low back pain without sciatica Yes   Make neurosurgeon appointment.  I have reviewed the [place] Controlled Substance Reporting System web site prior to prescribing narcotic medicine for this patient.  He says he was on a muscle relaxant.  He will check and get the name.  I will see him in two months.  Call if any problem.  Precautions discussed.  Electronically Signed Darreld Mclean, MD 7/12/202210:40 AM

## 2021-05-28 NOTE — Patient Instructions (Addendum)
You have been referred to [facility] on [facility] in [place], they will call you with appointment. If you have not heard anything in one week call them to schedule [phone]   Smoking Tobacco Information, Adult Smoking tobacco can be harmful to your health. Tobacco contains a poisonous (toxic), colorless chemical called nicotine. Nicotine is addictive. It changes the brain and can make it hard to stop smoking. Tobacco also has other toxicchemicals that can hurt your body and raise your risk of many cancers. How can smoking tobacco affect me? Smoking tobacco puts you at risk for: Cancer. Smoking is most commonly associated with lung cancer, but can also lead to cancer in other parts of the body. Chronic obstructive pulmonary disease (COPD). This is a long-term lung condition that makes it hard to breathe. It also gets worse over time. High blood pressure (hypertension), heart disease, stroke, or heart attack. Lung infections, such as pneumonia. Cataracts. This is when the lenses in the eyes become clouded. Digestive problems. This may include peptic ulcers, heartburn, and gastroesophageal reflux disease (GERD). Oral health problems, such as gum disease and tooth loss. Loss of taste and smell. Smoking can affect your appearance by causing: Wrinkles. Yellow or stained teeth, fingers, and fingernails. Smoking tobacco can also affect your social life, because: It may be challenging to find places to smoke when away from home. Many workplaces, [employer], hotels, and public places are tobacco-free. Smoking is expensive. This is due to the cost of tobacco and the long-term costs of treating health problems from smoking. Secondhand smoke may affect those around you. Secondhand smoke can cause lung cancer, breathing problems, and heart disease. Children of smokers have a higher risk for: Sudden infant death syndrome (SIDS). Ear infections. Lung infections. If you currently smoke  tobacco, quitting now can help you: Lead a longer and healthier life. Look, smell, breathe, and feel better over time. Save money. Protect others from the harms of secondhand smoke. What actions can I take to prevent health problems? Quit smoking  Do not start smoking. Quit if you already do. Make a plan to quit smoking and commit to it. Look for programs to help you and ask your health care provider for recommendations and ideas. Set a date and write down all the reasons you want to quit. Let your friends and family know you are quitting so they can help and support you. Consider finding friends who also want to quit. It can be easier to quit with someone else, so that you can support each other. Talk with your health care provider about using nicotine replacement medicines to help you quit, such as gum, lozenges, patches, sprays, or pills. Do not replace cigarette smoking with electronic cigarettes, which are commonly called e-cigarettes. The safety of e-cigarettes is not known, and some may contain harmful chemicals. If you try to quit but return to smoking, stay positive. It is common to slip up when you first quit, so take it one day at a time. Be prepared for cravings. When you feel the urge to smoke, chew gum or suck on hard candy.  Lifestyle Stay busy and take care of your body. Drink enough fluid to keep your urine pale yellow. Get plenty of exercise and eat a healthy diet. This can help prevent weight gain after quitting. Monitor your eating habits. Quitting smoking can cause you to have a larger appetite than when you smoke. Find ways to relax. Go out with friends or family to a movie  or a restaurant where people do not smoke. Ask your health care provider about having regular tests (screenings) to check for cancer. This may include blood tests, imaging tests, and other tests. Find ways to manage your stress, such as meditation, yoga, or exercise. Where to find support To get  support to quit smoking, consider: Asking your health care provider for more information and resources. Taking classes to learn more about quitting smoking. Looking for local organizations that offer resources about quitting smoking. Joining a support group for people who want to quit smoking in your local community. Calling the smokefree.gov counselor helpline: 1-800-Quit-Now (210)143-3694) Where to find more information You may find more information about quitting smoking from: HelpGuide.org: www.helpguide.org [employer]: smokefree.gov American Lung Association: www.lung.org Contact a health care provider if you: Have problems breathing. Notice that your lips, nose, or fingers turn blue. Have chest pain. Are coughing up blood. Feel faint or you pass out. Have other health changes that cause you to worry. Summary Smoking tobacco can negatively affect your health, the health of those around you, your finances, and your social life. Do not start smoking. Quit if you already do. If you need help quitting, ask your health care provider. Think about joining a support group for people who want to quit smoking in your local community. There are many effective programs that will help you to quit this behavior. This information is not intended to replace advice given to you by your health care provider. Make sure you discuss any questions you have with your healthcare provider. Document Revised: 09/25/2020 Document Reviewed: 09/25/2020 Elsevier Patient Education  2022 [employer].

## 2021-05-29 ENCOUNTER — Telehealth: Payer: Self-pay | Admitting: Orthopaedic Surgery

## 2021-05-29 NOTE — Telephone Encounter (Signed)
Patient called back to give Dr. Hilda Lias the name of the muscle relaxer he takes.   The name is  Bradley Mclaughlin he thinks  Pharmacy thru [facility].    Please call him back at [phone] if you have any questions.

## 2021-05-30 MED ORDER — METHOCARBAMOL 500 MG PO TABS: 500.0000 mg | ORAL_TABLET | Freq: Three times a day (TID) | ORAL | 1 refills | Status: DC

## 2021-06-03 ENCOUNTER — Telehealth: Payer: Self-pay | Admitting: Orthopaedic Surgery

## 2021-06-03 NOTE — Telephone Encounter (Signed)
Done

## 2021-06-03 NOTE — Telephone Encounter (Signed)
I see where Dr. Hilda Lias sent in the Robaxin, but not the Hydrocodone. I see in the note that Dr. Hilda Lias has where he was going to order the Hydrocodone but looks like he didn't. I guess you need to send this to Dr. Hilda Lias and I will go ahead and change the pharmacy in the system

## 2021-06-03 NOTE — Telephone Encounter (Signed)
Patient called to relay that "[facility] does not have either one of his medications prescribed by Dr Hilda Lias. Please advise.  Medications:     methocarbamol (ROBAXIN) 500 MG tablet 60 tablet                      HYDROcodone-acetaminophen (NORCO/VICODIN) 5-325 MG tablet 30 tablet     (I did not see this medication prescribed by Dr Hilda Lias) - please advise.    - Patient also relays that he would get them both through [facility] [place], if he cannot get through discount pharmacy [facility].

## 2021-06-04 MED ORDER — METHOCARBAMOL 500 MG PO TABS
500.0000 mg | ORAL_TABLET | Freq: Three times a day (TID) | ORAL | 1 refills | Status: DC
  Filled 2021-06-05: qty 60, 20d supply, fill #0
  Filled 2021-08-07: qty 60, 20d supply, fill #1

## 2021-06-04 MED ORDER — HYDROCODONE-ACETAMINOPHEN 5-325 MG PO TABS: 1.0000 | ORAL_TABLET | ORAL | 0 refills | Status: AC | PRN

## 2021-06-04 NOTE — Telephone Encounter (Signed)
Patient called back and states the RX for his muscle relaxer  He needs the   methocarbamol (ROBAXIN) 500 MG tablet+  To be called into [facility].

## 2021-06-05 ENCOUNTER — Telehealth: Payer: Self-pay

## 2021-06-05 ENCOUNTER — Other Ambulatory Visit: Payer: Self-pay

## 2021-06-05 NOTE — Telephone Encounter (Signed)
Notified client that for his Robaxin we were able to utilize [employer] His medication was transferred from [facility] and I will pick up today. Discussed that this time there will be no charge, if any other refills will be per pharmacy 4.00$ and can be mailed. Client will pick up at [facility] today or tomorrow.   Francee Nodal RN [employer]

## 2021-06-18 ENCOUNTER — Telehealth: Payer: Self-pay

## 2021-06-18 ENCOUNTER — Telehealth: Payer: Self-pay | Admitting: Orthopaedic Surgery

## 2021-06-18 NOTE — Telephone Encounter (Signed)
Client called this RN at 10:20 am. Client states Cyndia Skeeters is referring him to neurosurgeon in [place]. Client has filled out [facility] financial assistance.Explained to client that he should receive a letter in the mail that he has been approved and the copayments. I also discussed that D. Leavy Cella is out of the office until Monday and I can discuss it further with her at that time, she does those referrals for [facility] financial assistance. Client reports understanding.  [facility] also called this RN for approval for transportation to [facility] for client, approval was provided.  Francee Nodal RN Clara [employer]/[facility]

## 2021-06-18 NOTE — Telephone Encounter (Signed)
Bradley Mclaughlin called and stated that the neuro referral we were sending him to does not take his financial assistance or [facility].  He asks that we send referral to [facility] neuro.  He has worked this out with assistance from [employer]

## 2021-07-01 NOTE — Telephone Encounter (Signed)
Patient called back in response to message 06/27/21 per Leta's note. Relays he has been approved for [facility] financial assistance, effective 06/15/21 to 02/21/22 - states has a letter. Asking for referral to be changed to neurosurgeon in [place] if possible, as states for [facility], it would cost $200.00 and financial assistance does not cover. *SEE REFERRAL for all of this information*

## 2021-07-04 ENCOUNTER — Telehealth: Payer: Self-pay

## 2021-07-04 NOTE — Telephone Encounter (Signed)
Called client to follow up regarding ability to assist with transportation to [facility] for neurosurgeon appt.  [facility] will be able to assist. Client to call and set up appointment and notify this RN to arrange transportation. Client reported understanding.  Francee Nodal RN [employer]

## 2021-07-05 ENCOUNTER — Telehealth: Payer: Self-pay

## 2021-07-05 NOTE — Telephone Encounter (Signed)
Client called with appointment to [facility] for neurosurgery appointment at [address] [place].  [facility] approved transportation arrangements.  Called [facility] and arranged client to have transport on 07/10/21 at 10am to [place] and return.  Notified Client it has been arranged.  Francee Nodal RN [employer]

## 2021-07-29 ENCOUNTER — Other Ambulatory Visit: Payer: Self-pay | Admitting: Physician Assistant

## 2021-07-29 ENCOUNTER — Ambulatory Visit: Payer: Self-pay | Admitting: Physician Assistant

## 2021-07-29 ENCOUNTER — Encounter: Payer: Self-pay | Admitting: Physician Assistant

## 2021-07-29 DIAGNOSIS — F17200 Nicotine dependence, unspecified, uncomplicated: Secondary | ICD-10-CM

## 2021-07-29 DIAGNOSIS — M545 Low back pain, unspecified: Secondary | ICD-10-CM

## 2021-07-29 DIAGNOSIS — G729 Myopathy, unspecified: Secondary | ICD-10-CM

## 2021-07-29 DIAGNOSIS — F1010 Alcohol abuse, uncomplicated: Secondary | ICD-10-CM

## 2021-07-29 DIAGNOSIS — G8929 Other chronic pain: Secondary | ICD-10-CM

## 2021-07-29 DIAGNOSIS — R7989 Other specified abnormal findings of blood chemistry: Secondary | ICD-10-CM

## 2021-07-29 NOTE — Progress Notes (Signed)
There were no vitals taken for this visit.   Subjective:    Patient ID: Bradley Mclaughlin, male    DOB: 1966/02/18, 55 y.o.   MRN: [id number]  HPI: Bradley Mclaughlin is a 55 y.o. male presenting on 07/29/2021 for No chief complaint on file.   HPI   This is a telemedicine appointment through [employer].  I connected with  Bradley Mclaughlin on 07/29/21 by a video enabled telemedicine application and verified that I am speaking with the correct person using two identifiers.   I discussed the limitations of evaluation and management by telemedicine. The patient expressed understanding and agreed to proceed.  Pt is at home.  Provider is working from home office.    Pt is 54yoM with chronic hip and back pain who has appointment for routine follow up.  He continues to smoke.  He says he is still drinking but not as much.  He is not going to AA.   Pt was referred to orthopedics for chronic hip pain.  Orthopedics referred him to Neurosurgery after review of MRI.    Pt Saw Neurosurgery at [facility].  He got referrals to pain management, neurology and Physical Therapy.   Pt is requesting for as many of these as possible to be changed to local providers as he has difficulty with transportation.   Pt says he threw away his FIT test that was given to him for colon cancer screening.     Relevant past medical, surgical, family and social history reviewed and updated as indicated. Interim medical history since our last visit reviewed. Allergies and medications reviewed and updated.   Current Outpatient Medications:    doxepin (SINEQUAN) 100 MG capsule, Take 100 mg by mouth., Disp: , Rfl:    loratadine (CLARITIN) 10 MG tablet, Take 10 mg by mouth daily., Disp: , Rfl:    methocarbamol (ROBAXIN) 500 MG tablet, Take 1 tablet (500 mg total) by mouth 3 (three) times daily., Disp: 60 tablet, Rfl: 1   Multiple Vitamins-Minerals (CENTRUM SILVER PO), Take by mouth., Disp: , Rfl:    traZODone (DESYREL) 100 MG tablet,  Take 100 mg by mouth at bedtime., Disp: , Rfl:      Review of Systems  Per HPI unless specifically indicated above     Objective:    There were no vitals taken for this visit.  Wt Readings from Last 3 Encounters:  05/28/21 105 lb 8 oz (47.9 kg)  04/30/21 105 lb 12.8 oz (48 kg)  03/05/21 114 lb (51.7 kg)    Physical Exam Constitutional:      General: He is not in acute distress. HENT:     Head: Normocephalic and atraumatic.  Pulmonary:     Effort: No respiratory distress.     Comments: Pt is speaking in complete sentences without dyspnea Neurological:     Mental Status: He is alert and oriented to person, place, and time.  Psychiatric:        Behavior: Behavior normal.          Assessment & Plan:    Encounter Diagnoses  Name Primary?   Chronic right-sided low back pain without sciatica Yes   Myopathy    Elevated LFTs    Alcohol abuse    Tobacco use disorder      -Will try to get appointments locally.  Referral to neurology is placed.  Will check on coverage for physical therapy by [facility] financial assistance prior to placing that referral.  Pt is  encouraged to see pain management at [facility] as there is limited pain management through [facility] and none available in [place] or [place] counties.    Pt says his cafa and [facility] assistance are both active  -will Recheck CMP  for elevated LFTs  -pt is encouraged to go to AA to get help with etoh  -pt to Follow up 3 months.  He is to contact office sooner prn

## 2021-07-30 ENCOUNTER — Ambulatory Visit: Payer: Self-pay | Admitting: Orthopaedic Surgery

## 2021-08-07 ENCOUNTER — Other Ambulatory Visit: Payer: Self-pay

## 2021-08-07 ENCOUNTER — Telehealth: Payer: Self-pay

## 2021-08-07 NOTE — Telephone Encounter (Signed)
Attempted to schedule Transportation for client to PT on 08/14/21 at 0745, Transportation cannot get him to that appointment on time, therefore ask for client to attempt to reschedule.  Notified client of that issue and he will attempt to reschedule and let me know.  Client also states he needs assistance for his robaxin prescription. Prescription is with [facility] and he has a refill. But he is unable to pay copay today. Will contact [facility] pharmacy to discuss options and if medication can be mailed. Will let client know when I have that information.   Francee Nodal RN [employer]

## 2021-08-08 ENCOUNTER — Other Ambulatory Visit: Payer: Self-pay

## 2021-08-09 ENCOUNTER — Other Ambulatory Visit (HOSPITAL_COMMUNITY): Payer: Self-pay

## 2021-08-14 ENCOUNTER — Ambulatory Visit (HOSPITAL_COMMUNITY): Payer: Self-pay | Admitting: Physical Therapy

## 2021-08-20 ENCOUNTER — Telehealth: Payer: Self-pay

## 2021-08-20 NOTE — Telephone Encounter (Signed)
Contacted client that transportation has been arranged for 08/27/21 to PT at 1030.  Client states he also has an appointment for neurology in [place] this month.  Discussed that have been approved to help with transportation to [facility] pain clinic. Client aware that transportation could at any time change, he will proceed to contact pain clinic at [facility] and let this RN know appointment date.   Will call today and schedule transportation for 09/13/21 To [facility] at 0930am  Francee Nodal RN Clara [employer]/[facility]

## 2021-08-27 ENCOUNTER — Other Ambulatory Visit: Payer: Self-pay

## 2021-08-27 ENCOUNTER — Telehealth: Payer: Self-pay

## 2021-08-27 ENCOUNTER — Encounter (HOSPITAL_COMMUNITY): Payer: Self-pay | Admitting: Physical Therapy

## 2021-08-27 ENCOUNTER — Ambulatory Visit (HOSPITAL_COMMUNITY): Payer: Self-pay | Attending: Physician Assistant | Admitting: Physical Therapy

## 2021-08-27 DIAGNOSIS — R2689 Other abnormalities of gait and mobility: Secondary | ICD-10-CM | POA: Insufficient documentation

## 2021-08-27 DIAGNOSIS — R29898 Other symptoms and signs involving the musculoskeletal system: Secondary | ICD-10-CM | POA: Insufficient documentation

## 2021-08-27 DIAGNOSIS — M6281 Muscle weakness (generalized): Secondary | ICD-10-CM | POA: Insufficient documentation

## 2021-08-27 DIAGNOSIS — M545 Low back pain, unspecified: Secondary | ICD-10-CM | POA: Insufficient documentation

## 2021-08-27 NOTE — Telephone Encounter (Signed)
Returned call to client. He had is Outpatient Rehab appointment today in [place]. He states they want to see him now twice a week for up to 6 weeks.  He will need transportation.  Arranged transportation for the following dates to [facility] outpatient Rehab. 08/29/21 at 0900 09/03/21 at 1000 09/12/21 at 1000  Also confirmed transportation to [facility] neurologic for 09/13/21 at 0930. Confirmed that has been arranged   Client understands he will get a call the day prior for confirmation of pickup times.  Francee Nodal RN [employer]

## 2021-08-27 NOTE — Patient Instructions (Signed)
Access Code: [email or web address] URL: [email or web address] Date: 08/27/2021 Prepared by: Greig Castilla Miquan Tandon  Exercises Supine Bridge - 2 x daily - 7 x weekly - 3 sets - 10 reps Clamshell - 2 x daily - 7 x weekly - 3 sets - 10 reps

## 2021-08-27 NOTE — Therapy (Signed)
[facility] [address] [place], [place], [address] Phone: [phone]   Fax:  [phone]  Physical Therapy Evaluation  Patient Details  Name: Bradley Mclaughlin MRN: [id number] Date of Birth: [birth year] Referring Provider (PT): Jacquelin Hawking PA-C   Encounter Date: 08/27/2021   PT End of Session - 08/27/21 1124     Visit Number 1    Number of Visits 12    Date for PT Re-Evaluation 10/08/21    Authorization Type financial assistance    PT Start Time 1050    PT Stop Time 1122    PT Time Calculation (min) 32 min    Activity Tolerance Patient tolerated treatment well    Behavior During Therapy [facility] for tasks assessed/performed             Past Medical History:  Diagnosis Date   Bipolar depression (HCC)    Cirrhosis of liver (HCC)     Past Surgical History:  Procedure Laterality Date   FRACTURE SURGERY     femur   HAND SURGERY Right     There were no vitals filed for this visit.    Subjective Assessment - 08/27/21 1051     Subjective Patient is a 55 y.o. male ho presents to physical therapy with c/o chronic R LBP. Patient states MD in [place] states he has fat in his back pushing up against vertebrae. Patient states hx of back pain for 20 years. Patient states symptoms increase with movement. Patient states meds and rest helps. Patient is here because he was told to come. He uses a cane mostly for walking. He broke his hip back in the 80s.    Limitations Standing;Walking;House hold activities    Currently in Pain? No/denies   at rest               [facility] PT Assessment - 08/27/21 0001       Assessment   Medical Diagnosis Chronic LBP    Referring Provider (PT) Jacquelin Hawking PA-C    Onset Date/Surgical Date 08/27/01    Next MD Visit January      Precautions   Precautions Fall      Restrictions   Weight Bearing Restrictions No      Balance Screen   Has the patient fallen in the past 6 months Yes     How many times? 5    Has the patient had a decrease in activity level because of a fear of falling?  No    Is the patient reluctant to leave their home because of a fear of falling?  No      Prior Function   Level of Independence Independent    Vocation Unemployed      Cognition   Overall Cognitive Status Within Functional Limits for tasks assessed      Observation/Other Assessments   Observations Ambulates with SPC    Focus on Therapeutic Outcomes (FOTO)  54% function      ROM / Strength   AROM / PROM / Strength AROM;Strength      AROM   AROM Assessment Site Lumbar    Lumbar Flexion 0% limited, pain with getting back up    Lumbar Extension 25% limited    Lumbar - Right Side Bend 0% limited    Lumbar - Left Side Bend 0% limited    Lumbar - Right Rotation 0% limited    Lumbar - Left Rotation 0% limited      Strength  Strength Assessment Site Hip;Knee;Ankle    Right/Left Hip Right;Left    Right Hip Flexion 3+/5    Right Hip Extension 2+/5    Right Hip ABduction 2/5    Left Hip Flexion 3+/5    Left Hip Extension 2+/5    Left Hip ABduction 2+/5    Right/Left Knee Right;Left    Right Knee Flexion 4-/5    Right Knee Extension 4+/5    Left Knee Flexion 4-/5    Left Knee Extension 4+/5    Right/Left Ankle Right;Left    Right Ankle Dorsiflexion 4-/5    Left Ankle Dorsiflexion 4/5      Palpation   Palpation comment TTP lumbar paraspinals                        Objective measurements completed on examination: See above findings.       [facility] Adult PT Treatment/Exercise - 08/27/21 0001       Exercises   Exercises Lumbar      Lumbar Exercises: Supine   Bridge 10 reps      Lumbar Exercises: Sidelying   Clam Both;10 reps                     PT Education - 08/27/21 1051     Education Details Patient educated on exam findings, POC, scope of PT, HEP    Person(s) Educated Patient    Methods Explanation;Demonstration;Handout     Comprehension Verbalized understanding;Returned demonstration              PT Short Term Goals - 08/27/21 1127       PT SHORT TERM GOAL #1   Title Patient will be independent with HEP in order to improve functional outcomes.    Time 3    Period Weeks    Status New    Target Date 09/17/21      PT SHORT TERM GOAL #2   Title Patient will report at least 25% improvement in symptoms for improved quality of life.    Time 3    Period Weeks    Status New    Target Date 09/17/21               PT Long Term Goals - 08/27/21 1128       PT LONG TERM GOAL #1   Title Patient will report at least 75% improvement in symptoms for improved quality of life.    Time 6    Period Weeks    Status New    Target Date 10/08/21      PT LONG TERM GOAL #2   Title Patient will improve FOTO score by at least 10 points in order to indicate improved tolerance to activity.    Time 6    Period Weeks    Status New    Target Date 10/08/21      PT LONG TERM GOAL #3   Title Patient will demonstrate at least 1 grade high on hip MMT to demonstrate improving strength for chores.    Time 6    Period Weeks    Status New    Target Date 10/08/21                    Plan - 08/27/21 1124     Clinical Impression Statement Patient is a 55 y.o. male ho presents to physical therapy with c/o chronic LBP. He presents with pain limited deficits in  lumbar spine and hip strength, ROM, endurance, postural impairments, spinal mobility, gait and functional mobility with ADL. He is having to modify and restrict ADL as indicated by FOTO score as well as subjective information and objective measures which is affecting overall participation. Patient will benefit from skilled physical therapy in order to improve function and reduce impairment.    Personal Factors and Comorbidities Past/Current Experience;Fitness;Time since onset of injury/illness/exacerbation    Examination-Activity Limitations Locomotion  Level;Transfers;Sleep;Squat;Stairs;Stand;Lift;Bend    Examination-Participation Restrictions Meal Prep;Cleaning;Community Activity;Shop;Volunteer;[name]    Stability/Clinical Decision Making Stable/Uncomplicated    Clinical Decision Making Low    Rehab Potential Fair    PT Frequency 2x / week    PT Duration 6 weeks    PT Treatment/Interventions ADLs/Self Care Home Management;Aquatic Therapy;Cryotherapy;Electrical Stimulation;Iontophoresis 4mg /ml Dexamethasone;Moist Heat;Traction;DME Instruction;Gait training;Stair training;Functional mobility training;Therapeutic activities;Therapeutic exercise;Balance training;Patient/family education;Neuromuscular re-education;Orthotic Fit/Training;Manual techniques;Compression bandaging;Manual lymph drainage;Scar mobilization;Passive range of motion;Energy conservation;Dry needling;Splinting;Taping;Spinal Manipulations;Joint Manipulations    PT Next Visit Plan hip strength and core strength, possibly functional strength and possibly gait/balance training    PT Home Exercise Plan bridge, clam    Consulted and Agree with Plan of Care Patient             Patient will benefit from skilled therapeutic intervention in order to improve the following deficits and impairments:  Abnormal gait, Difficulty walking, Decreased range of motion, Decreased activity tolerance, Decreased balance, Decreased mobility, Decreased strength, Improper body mechanics, Impaired flexibility, Postural dysfunction, Pain  Visit Diagnosis: Low back pain, unspecified back pain laterality, unspecified chronicity, unspecified whether sciatica present  Muscle weakness (generalized)  Other abnormalities of gait and mobility  Other symptoms and signs involving the musculoskeletal system     Problem List There are no problems to display for this patient.   11:30 AM, 08/27/21 10/27/21 PT, DPT Physical Therapist at [facility]   [facility] [address] [place], [place], [place] Phone: [phone]   Fax:  [phone]  Name: ROLLY MAGRI MRN: Tyler Aas Date of Birth: Sep 04, 1966

## 2021-08-29 ENCOUNTER — Ambulatory Visit (HOSPITAL_COMMUNITY): Payer: Self-pay | Admitting: Physical Therapy

## 2021-08-29 ENCOUNTER — Other Ambulatory Visit: Payer: Self-pay

## 2021-08-29 ENCOUNTER — Encounter (HOSPITAL_COMMUNITY): Payer: Self-pay | Admitting: Physical Therapy

## 2021-08-29 DIAGNOSIS — R29898 Other symptoms and signs involving the musculoskeletal system: Secondary | ICD-10-CM

## 2021-08-29 DIAGNOSIS — R2689 Other abnormalities of gait and mobility: Secondary | ICD-10-CM

## 2021-08-29 DIAGNOSIS — M6281 Muscle weakness (generalized): Secondary | ICD-10-CM

## 2021-08-29 DIAGNOSIS — M545 Low back pain, unspecified: Secondary | ICD-10-CM

## 2021-08-29 NOTE — Patient Instructions (Signed)
Access Code: [email or web address] URL: [email or web address] Date: 08/29/2021 Prepared by: Georges Lynch  Exercises Hooklying Gluteal Sets - 2-3 x daily - 7 x weekly - 1 sets - 10 reps - 5 second hold Hooklying Isometric Hip Abduction with Belt - 2-3 x daily - 7 x weekly - 1 sets - 10 reps - 5 second hold Hooklying Single Knee to Chest Stretch - 2-3 x daily - 7 x weekly - 1 sets - 5 reps - 30 second hold

## 2021-08-29 NOTE — Therapy (Signed)
[facility] [address] [place], [place], [address] Phone: [phone]   Fax:  [phone]  Physical Therapy Treatment  Patient Details  Name: Bradley Mclaughlin MRN: [id number] Date of Birth: Dec 21, 1965 Referring Provider (PT): Jacquelin Hawking PA-C   Encounter Date: 08/29/2021   PT End of Session - 08/29/21 0909     Visit Number 2    Number of Visits 12    Date for PT Re-Evaluation 10/08/21    Authorization Type financial assistance    PT Start Time 0903    PT Stop Time 0942    PT Time Calculation (min) 39 min    Activity Tolerance Patient tolerated treatment well    Behavior During Therapy [facility] for tasks assessed/performed             Past Medical History:  Diagnosis Date   Bipolar depression (HCC)    Cirrhosis of liver (HCC)     Past Surgical History:  Procedure Laterality Date   FRACTURE SURGERY     femur   HAND SURGERY Right     There were no vitals filed for this visit.   Subjective Assessment - 08/29/21 0908     Subjective Patient states he is feeling sore today. Has been doing HEP daily. Pain about a 6 in low back currently.    Limitations Standing;Walking;House hold activities    Currently in Pain? Yes    Pain Score 6     Pain Location Back    Pain Orientation Posterior;Lower    Pain Descriptors / Indicators Aching    Pain Type Chronic pain                               [facility] Adult PT Treatment/Exercise - 08/29/21 0001       Lumbar Exercises: Stretches   Single Knee to Chest Stretch Right;Left;3 reps;30 seconds   improved back pain   Double Knee to Chest Stretch 2 reps;30 seconds    Other Lumbar Stretch Exercise seated lumbar flexion stretching to chair placed in front of patient 5 x 10"      Lumbar Exercises: Seated   Other Seated Lumbar Exercises seated heel raises 2 x 15      Lumbar Exercises: Supine   Ab Set 10 reps;5 seconds    Glut Set 15 reps;5 seconds    Bridge 10  reps   very diffucult, increased low back pain   Other Supine Lumbar Exercises supine clam 10 x10"    Other Supine Lumbar Exercises Supine hip extension  (decompression) 10 x 5" each      Lumbar Exercises: Sidelying   Clam Both;10 reps                       PT Short Term Goals - 08/27/21 1127       PT SHORT TERM GOAL #1   Title Patient will be independent with HEP in order to improve functional outcomes.    Time 3    Period Weeks    Status New    Target Date 09/17/21      PT SHORT TERM GOAL #2   Title Patient will report at least 25% improvement in symptoms for improved quality of life.    Time 3    Period Weeks    Status New    Target Date 09/17/21  PT Long Term Goals - 08/27/21 1128       PT LONG TERM GOAL #1   Title Patient will report at least 75% improvement in symptoms for improved quality of life.    Time 6    Period Weeks    Status New    Target Date 10/08/21      PT LONG TERM GOAL #2   Title Patient will improve FOTO score by at least 10 points in order to indicate improved tolerance to activity.    Time 6    Period Weeks    Status New    Target Date 10/08/21      PT LONG TERM GOAL #3   Title Patient will demonstrate at least 1 grade high on hip MMT to demonstrate improving strength for chores.    Time 6    Period Weeks    Status New    Target Date 10/08/21                   Plan - 08/29/21 0936     Clinical Impression Statement Patient presents with elevated pain levels, noting increased soreness and difficulty with HEP exercise. Patient with overall poor tolerance with todays activity. Introduced gentle lumbar mobility with SKTC and DKTC stretching. Patient educated on activity pacing and decreasing efforts with isometrics squeeze/ holds to increase activity tolerance. Educated patient on isometric and stretch focused HEP and issued updated handout to improve activity tolerance and decrease subjective complaint  of pain/ soreness. Patient will continue to benefit from skilled therapy services to progress glute and core strength as tolerated to reduce back pain and improve functional level.    Personal Factors and Comorbidities Past/Current Experience;Fitness;Time since onset of injury/illness/exacerbation    Examination-Activity Limitations Locomotion Level;Transfers;Sleep;Squat;Stairs;Stand;Lift;Bend    Examination-Participation Restrictions Meal Prep;Cleaning;Community Activity;Shop;Volunteer;Pincus Badder [facility]    Stability/Clinical Decision Making Stable/Uncomplicated    Rehab Potential Fair    PT Frequency 2x / week    PT Duration 6 weeks    PT Treatment/Interventions ADLs/Self Care Home Management;Aquatic Therapy;Cryotherapy;Electrical Stimulation;Iontophoresis 4mg /ml Dexamethasone;Moist Heat;Traction;DME Instruction;Gait training;Stair training;Functional mobility training;Therapeutic activities;Therapeutic exercise;Balance training;Patient/family education;Neuromuscular re-education;Orthotic Fit/Training;Manual techniques;Compression bandaging;Manual lymph drainage;Scar mobilization;Passive range of motion;Energy conservation;Dry needling;Splinting;Taping;Spinal Manipulations;Joint Manipulations    PT Next Visit Plan hip strength and core strength, possibly functional strength and possibly gait/balance training    PT Home Exercise Plan bridge, clam 10/13 SKTC, glute set, hip abd iso    Consulted and Agree with Plan of Care Patient             Patient will benefit from skilled therapeutic intervention in order to improve the following deficits and impairments:  Abnormal gait, Difficulty walking, Decreased range of motion, Decreased activity tolerance, Decreased balance, Decreased mobility, Decreased strength, Improper body mechanics, Impaired flexibility, Postural dysfunction, Pain  Visit Diagnosis: Low back pain, unspecified back pain laterality, unspecified chronicity, unspecified whether  sciatica present  Muscle weakness (generalized)  Other abnormalities of gait and mobility  Other symptoms and signs involving the musculoskeletal system     Problem List There are no problems to display for this patient.  9:42 AM, 08/29/21 08/31/21 PT DPT  Physical Therapist with [place]  [facility]  [phone]   [facility] [address] [place], [place], [place] Phone: [phone]   Fax:  [phone]  Name: AMBER GUTHRIDGE MRN: Tyler Aas Date of Birth: 10-22-1966

## 2021-09-03 ENCOUNTER — Other Ambulatory Visit: Payer: Self-pay

## 2021-09-03 ENCOUNTER — Ambulatory Visit (HOSPITAL_COMMUNITY): Payer: Self-pay

## 2021-09-03 ENCOUNTER — Encounter (HOSPITAL_COMMUNITY): Payer: Self-pay

## 2021-09-03 DIAGNOSIS — M545 Low back pain, unspecified: Secondary | ICD-10-CM

## 2021-09-03 DIAGNOSIS — M6281 Muscle weakness (generalized): Secondary | ICD-10-CM

## 2021-09-03 DIAGNOSIS — R29898 Other symptoms and signs involving the musculoskeletal system: Secondary | ICD-10-CM

## 2021-09-03 DIAGNOSIS — R2689 Other abnormalities of gait and mobility: Secondary | ICD-10-CM

## 2021-09-03 NOTE — Therapy (Signed)
[facility] [address] [place], [place], [address] Phone: [phone]   Fax:  [phone]  Physical Therapy Treatment  Patient Details  Name: Bradley Mclaughlin MRN: [id number] Date of Birth: 04-13-1966 Referring Provider (PT): Jacquelin Hawking PA-C   Encounter Date: 09/03/2021   PT End of Session - 09/03/21 1012     Visit Number 3    Number of Visits 12    Date for PT Re-Evaluation 10/08/21    Authorization Type financial assistance    PT Start Time 1007    PT Stop Time 1047    PT Time Calculation (min) 40 min    Activity Tolerance Patient tolerated treatment well    Behavior During Therapy [facility] for tasks assessed/performed             Past Medical History:  Diagnosis Date   Bipolar depression (HCC)    Cirrhosis of liver (HCC)     Past Surgical History:  Procedure Laterality Date   FRACTURE SURGERY     femur   HAND SURGERY Right     There were no vitals filed for this visit.   Subjective Assessment - 09/03/21 1011     Subjective Pt reports he is feeling good today, no reoprts of pain.  Reports a death in the family, sad entrance.    Currently in Pain? No/denies                               [facility] Adult PT Treatment/Exercise - 09/03/21 0001       Exercises   Exercises Lumbar      Lumbar Exercises: Standing   Heel Raises 10 reps    Other Standing Lumbar Exercises standing bent knee raise with core sets 10x alternating; isometric hip abd against wall 10x 5", wiht 4in step for LLD    Other Standing Lumbar Exercises attempted paloff, increased cueing to reduce lumbar extension      Lumbar Exercises: Seated   Sit to Stand 10 reps    Sit to Stand Limitations STS, cueing for mechanics, reoprts of tightness in back      Lumbar Exercises: Supine   Ab Set 10 reps;5 seconds    Glut Set 15 reps;5 seconds    Heel Slides 5 reps    Bent Knee Raise 10 reps    Bent Knee Raise Limitations with ab set     Bridge 10 reps    Bridge Limitations partial range, cueing to reduce knee adduction                       PT Short Term Goals - 08/27/21 1127       PT SHORT TERM GOAL #1   Title Patient will be independent with HEP in order to improve functional outcomes.    Time 3    Period Weeks    Status New    Target Date 09/17/21      PT SHORT TERM GOAL #2   Title Patient will report at least 25% improvement in symptoms for improved quality of life.    Time 3    Period Weeks    Status New    Target Date 09/17/21               PT Long Term Goals - 08/27/21 1128       PT LONG TERM GOAL #1   Title Patient will  report at least 75% improvement in symptoms for improved quality of life.    Time 6    Period Weeks    Status New    Target Date 10/08/21      PT LONG TERM GOAL #2   Title Patient will improve FOTO score by at least 10 points in order to indicate improved tolerance to activity.    Time 6    Period Weeks    Status New    Target Date 10/08/21      PT LONG TERM GOAL #3   Title Patient will demonstrate at least 1 grade high on hip MMT to demonstrate improving strength for chores.    Time 6    Period Weeks    Status New    Target Date 10/08/21                   Plan - 09/03/21 1035     Clinical Impression Statement Session focus on core and proximal strengthening.  Attempted standing core stability exercises, pt presents with increased hypermobility and tendency to extend lumbar region during all abdominal based activities. Was able to add STS and squats to POC for functional strengthening, did require demonstrate and verbal/tactile cueing for proper mechanics wiht new exercises.  Pt presents with increased difficulty with RT LE movements in supine position based exercises.  Added core stabilty exercises to HEP with printout given and verbalized understanding.    Personal Factors and Comorbidities Past/Current Experience;Fitness;Time since onset of  injury/illness/exacerbation    Examination-Activity Limitations Locomotion Level;Transfers;Sleep;Squat;Stairs;Stand;Lift;Bend    Examination-Participation Restrictions Meal Prep;Cleaning;Community Activity;Shop;Volunteer;[name]    Stability/Clinical Decision Making Stable/Uncomplicated    Clinical Decision Making Low    Rehab Potential Fair    PT Duration 6 weeks    PT Treatment/Interventions ADLs/Self Care Home Management;Aquatic Therapy;Cryotherapy;Electrical Stimulation;Iontophoresis 4mg /ml Dexamethasone;Moist Heat;Traction;DME Instruction;Gait training;Stair training;Functional mobility training;Therapeutic activities;Therapeutic exercise;Balance training;Patient/family education;Neuromuscular re-education;Orthotic Fit/Training;Manual techniques;Compression bandaging;Manual lymph drainage;Scar mobilization;Passive range of motion;Energy conservation;Dry needling;Splinting;Taping;Spinal Manipulations;Joint Manipulations    PT Next Visit Plan hip strength and core strength, possibly functional strength and possibly gait/balance training    PT Home Exercise Plan bridge, clam 10/13 SKTC, glute set, hip abd iso; 10/18: bent knee raise and supine heel slides.    Consulted and Agree with Plan of Care Patient             Patient will benefit from skilled therapeutic intervention in order to improve the following deficits and impairments:  Abnormal gait, Difficulty walking, Decreased range of motion, Decreased activity tolerance, Decreased balance, Decreased mobility, Decreased strength, Improper body mechanics, Impaired flexibility, Postural dysfunction, Pain  Visit Diagnosis: Low back pain, unspecified back pain laterality, unspecified chronicity, unspecified whether sciatica present  Muscle weakness (generalized)  Other abnormalities of gait and mobility  Other symptoms and signs involving the musculoskeletal system     Problem List There are no problems to display for this  patient.  11/18, LPTA/CLT; CBIS [phone]  [phone], PTA 09/03/2021, 6:42 PM  [facility] [address] [place], [place], [place] Phone: [phone]   Fax:  [phone]  Name: Bradley Mclaughlin MRN: Tyler Aas Date of Birth: 1966-02-06

## 2021-09-03 NOTE — Patient Instructions (Signed)
Bracing With Leg March (Hook-Lying)    With neutral spine, tighten pelvic floor and abdominals and hold.  Alternating legs, lift foot ___ inches and return to floor. Repeat 10 times. Do 2 times a day.   Copyright  VHI. All rights reserved.   Heel Slides    Squeeze pelvic floor and hold. Slide left heel along bed towards bottom. Hold for ___ seconds.  Slide back to flat knee position. Repeat 10 times. Do 2 times a day. Repeat with other leg.    Copyright  VHI. All rights reserved.

## 2021-09-09 ENCOUNTER — Telehealth: Payer: Self-pay

## 2021-09-09 NOTE — Telephone Encounter (Signed)
Client of [facility] called requesting transportation assistance for upcoming PT appointments to [facility].  Nov 1 at 1:30PM Nov 3 at  10:15 am Nov 8 at 10:45 am.  Also client notified RN about need for labs to be drawn for [facility]. Confirmed with Clinic staff client needs to do those as soon as possible. Will schedule client transportation to have labs drawn for 09/11/21 at 0900.   Francee Nodal RN [employer]

## 2021-09-12 ENCOUNTER — Ambulatory Visit (HOSPITAL_COMMUNITY): Payer: Self-pay

## 2021-09-12 ENCOUNTER — Other Ambulatory Visit: Payer: Self-pay

## 2021-09-12 ENCOUNTER — Encounter (HOSPITAL_COMMUNITY): Payer: Self-pay

## 2021-09-12 DIAGNOSIS — R2689 Other abnormalities of gait and mobility: Secondary | ICD-10-CM

## 2021-09-12 DIAGNOSIS — M545 Low back pain, unspecified: Secondary | ICD-10-CM

## 2021-09-12 DIAGNOSIS — R29898 Other symptoms and signs involving the musculoskeletal system: Secondary | ICD-10-CM

## 2021-09-12 DIAGNOSIS — M6281 Muscle weakness (generalized): Secondary | ICD-10-CM

## 2021-09-12 NOTE — Therapy (Signed)
[facility] [address] [place], [place], [address] Phone: [phone]   Fax:  [phone]  Physical Therapy Treatment  Patient Details  Name: Bradley Mclaughlin MRN: [id number] Date of Birth: Jul 17, 1966 Referring Provider (PT): Jacquelin Hawking PA-C   Encounter Date: 09/12/2021   PT End of Session - 09/12/21 1011     Visit Number 4    Number of Visits 12    Date for PT Re-Evaluation 10/08/21    Authorization Type financial assistance    PT Start Time 0958    PT Stop Time 1040    PT Time Calculation (min) 42 min    Activity Tolerance Patient tolerated treatment well    Behavior During Therapy [facility] for tasks assessed/performed             Past Medical History:  Diagnosis Date   Bipolar depression (HCC)    Cirrhosis of liver (HCC)     Past Surgical History:  Procedure Laterality Date   FRACTURE SURGERY     femur   HAND SURGERY Right     There were no vitals filed for this visit.   Subjective Assessment - 09/12/21 1005     Subjective Pt stated he is feeling good today, no reports of pain.  Stated he has walked some around the house wihtout cane, no reports of LOB episodes.    Limitations Standing;Walking;House hold activities    Currently in Pain? No/denies                               [facility] Adult PT Treatment/Exercise - 09/12/21 0001       Exercises   Exercises Lumbar      Lumbar Exercises: Standing   Heel Raises 15 reps    Functional Squats 10 reps    Functional Squats Limitations front of chair, cueing for mechanics    Other Standing Lumbar Exercises standing bent knee raise alternating with HHA 10x5"    Other Standing Lumbar Exercises Standing and retro gait in of line for posture awarenesssidestep inside // bars 10RT no HHA; tandem stance 3x 30"      Lumbar Exercises: Seated   Sit to Stand 10 reps    Sit to Stand Limitations STS, cueing for mechanics, eccentric control      Lumbar  Exercises: Supine   Glut Set 15 reps;5 seconds    Clam 10 reps    Clam Limitations cueing for stability, encouraged to complete without HHA    Heel Slides 10 reps    Bent Knee Raise 10 reps    Bent Knee Raise Limitations with ab set    Bridge 10 reps    Bridge Limitations partial range, cueing to reduce knee adduction    Other Supine Lumbar Exercises isometric add with ball 10x5"      Lumbar Exercises: Sidelying   Clam Both;15 reps                       PT Short Term Goals - 08/27/21 1127       PT SHORT TERM GOAL #1   Title Patient will be independent with HEP in order to improve functional outcomes.    Time 3    Period Weeks    Status New    Target Date 09/17/21      PT SHORT TERM GOAL #2   Title Patient will report at least 25% improvement  in symptoms for improved quality of life.    Time 3    Period Weeks    Status New    Target Date 09/17/21               PT Long Term Goals - 08/27/21 1128       PT LONG TERM GOAL #1   Title Patient will report at least 75% improvement in symptoms for improved quality of life.    Time 6    Period Weeks    Status New    Target Date 10/08/21      PT LONG TERM GOAL #2   Title Patient will improve FOTO score by at least 10 points in order to indicate improved tolerance to activity.    Time 6    Period Weeks    Status New    Target Date 10/08/21      PT LONG TERM GOAL #3   Title Patient will demonstrate at least 1 grade high on hip MMT to demonstrate improving strength for chores.    Time 6    Period Weeks    Status New    Target Date 10/08/21                   Plan - 09/12/21 1047     Clinical Impression Statement Session focus on core stability and proximal strengthening.  Used vertical line to improve awareness of posture as pt presents with lumbar extension upon standing and gait.  Add standing balance activities iwth improved awareness of posture with activities.  No reports of increased  pain, was limited by some soreness wiht new activities.    Personal Factors and Comorbidities Past/Current Experience;Fitness;Time since onset of injury/illness/exacerbation    Examination-Activity Limitations Locomotion Level;Transfers;Sleep;Squat;Stairs;Stand;Lift;Bend    Examination-Participation Restrictions Meal Prep;Cleaning;Community Activity;Shop;Volunteer;[name]    Stability/Clinical Decision Making Stable/Uncomplicated    Clinical Decision Making Low    Rehab Potential Fair    PT Frequency 2x / week    PT Duration 6 weeks    PT Treatment/Interventions ADLs/Self Care Home Management;Aquatic Therapy;Cryotherapy;Electrical Stimulation;Iontophoresis 4mg /ml Dexamethasone;Moist Heat;Traction;DME Instruction;Gait training;Stair training;Functional mobility training;Therapeutic activities;Therapeutic exercise;Balance training;Patient/family education;Neuromuscular re-education;Orthotic Fit/Training;Manual techniques;Compression bandaging;Manual lymph drainage;Scar mobilization;Passive range of motion;Energy conservation;Dry needling;Splinting;Taping;Spinal Manipulations;Joint Manipulations    PT Next Visit Plan hip strength and core strength, possibly functional strength and possibly gait/balance training    PT Home Exercise Plan bridge, clam 10/13 SKTC, glute set, hip abd iso; 10/18: bent knee raise and supine heel slides.    Consulted and Agree with Plan of Care Patient             Patient will benefit from skilled therapeutic intervention in order to improve the following deficits and impairments:  Abnormal gait, Difficulty walking, Decreased range of motion, Decreased activity tolerance, Decreased balance, Decreased mobility, Decreased strength, Improper body mechanics, Impaired flexibility, Postural dysfunction, Pain  Visit Diagnosis: Low back pain, unspecified back pain laterality, unspecified chronicity, unspecified whether sciatica present  Muscle weakness  (generalized)  Other abnormalities of gait and mobility  Other symptoms and signs involving the musculoskeletal system     Problem List There are no problems to display for this patient.  11/18, LPTA/CLT; CBIS [phone]  [phone], PTA 09/12/2021, 10:52 AM  [facility] [address] [place], [place], [place] Phone: [phone]   Fax:  [phone]  Name: Bradley Mclaughlin MRN: Tyler Aas Date of Birth: [birth year]

## 2021-09-13 ENCOUNTER — Ambulatory Visit: Payer: Self-pay | Admitting: Neurology

## 2021-09-13 ENCOUNTER — Encounter: Payer: Self-pay | Admitting: Neurology

## 2021-09-17 ENCOUNTER — Other Ambulatory Visit: Payer: Self-pay

## 2021-09-17 ENCOUNTER — Telehealth: Payer: Self-pay

## 2021-09-17 ENCOUNTER — Encounter (HOSPITAL_COMMUNITY): Payer: Self-pay | Admitting: Physical Therapy

## 2021-09-17 ENCOUNTER — Ambulatory Visit (HOSPITAL_COMMUNITY): Payer: Self-pay | Attending: Physician Assistant | Admitting: Physical Therapy

## 2021-09-17 DIAGNOSIS — R29898 Other symptoms and signs involving the musculoskeletal system: Secondary | ICD-10-CM

## 2021-09-17 DIAGNOSIS — R2689 Other abnormalities of gait and mobility: Secondary | ICD-10-CM

## 2021-09-17 DIAGNOSIS — M545 Low back pain, unspecified: Secondary | ICD-10-CM

## 2021-09-17 DIAGNOSIS — M6281 Muscle weakness (generalized): Secondary | ICD-10-CM

## 2021-09-17 NOTE — Therapy (Signed)
[facility] [address] [place], [place], [address] Phone: [phone]   Fax:  [phone]  Physical Therapy Treatment  Patient Details  Name: Bradley Mclaughlin MRN: [id number] Date of Birth: [birth year] Referring Provider (PT): Jacquelin Hawking PA-C   Encounter Date: 09/17/2021   PT End of Session - 09/17/21 1310     Visit Number 5    Number of Visits 12    Date for PT Re-Evaluation 10/08/21    Authorization Type financial assistance    PT Start Time 1312    PT Stop Time 1351    PT Time Calculation (min) 39 min    Activity Tolerance Patient tolerated treatment well    Behavior During Therapy [facility] for tasks assessed/performed             Past Medical History:  Diagnosis Date   Bipolar depression (HCC)    Cirrhosis of liver (HCC)     Past Surgical History:  Procedure Laterality Date   FRACTURE SURGERY     femur   HAND SURGERY Right     There were no vitals filed for this visit.   Subjective Assessment - 09/17/21 1311     Subjective Patient states he has been feeling better. He has trouble with hip abduction exercises.    Limitations Standing;Walking;House hold activities    Currently in Pain? No/denies                               [facility] Adult PT Treatment/Exercise - 09/17/21 0001       Lumbar Exercises: Stretches   Single Knee to Chest Stretch Right;Left;3 reps;30 seconds      Lumbar Exercises: Standing   Heel Raises 20 reps    Functional Squats 10 reps    Functional Squats Limitations at counter top    Row Both;10 reps    Theraband Level (Row) Level 3 (Green)    Row Limitations 2 sets    Shoulder Extension Both;10 reps    Theraband Level (Shoulder Extension) Level 3 (Green)    Shoulder Extension Limitations 2 sets    Other Standing Lumbar Exercises standing bent knee raise alternating with HHA 2x 10 bilateral    Other Standing Lumbar Exercises tandem stance2x 30 seconds each       Lumbar Exercises: Seated   Sit to Stand 10 reps    Sit to Stand Limitations STS, cueing for mechanics, eccentric control; 2 sets      Lumbar Exercises: Supine   Bent Knee Raise 10 reps    Bent Knee Raise Limitations with ab set    Bridge 10 reps;5 seconds    Bridge Limitations partial range, cueing to reduce knee adduction; 2 sets    Other Supine Lumbar Exercises hip abd iso 10 x 5 second holds, 2 sets    Other Supine Lumbar Exercises DKTC with green ball 10x 5 second holds      Lumbar Exercises: Sidelying   Clam Both;15 reps                     PT Education - 09/17/21 1311     Education Details HEP    Person(s) Educated Patient    Methods Explanation;Demonstration    Comprehension Verbalized understanding;Returned demonstration              PT Short Term Goals - 08/27/21 1127       PT SHORT TERM  GOAL #1   Title Patient will be independent with HEP in order to improve functional outcomes.    Time 3    Period Weeks    Status New    Target Date 09/17/21      PT SHORT TERM GOAL #2   Title Patient will report at least 25% improvement in symptoms for improved quality of life.    Time 3    Period Weeks    Status New    Target Date 09/17/21               PT Long Term Goals - 08/27/21 1128       PT LONG TERM GOAL #1   Title Patient will report at least 75% improvement in symptoms for improved quality of life.    Time 6    Period Weeks    Status New    Target Date 10/08/21      PT LONG TERM GOAL #2   Title Patient will improve FOTO score by at least 10 points in order to indicate improved tolerance to activity.    Time 6    Period Weeks    Status New    Target Date 10/08/21      PT LONG TERM GOAL #3   Title Patient will demonstrate at least 1 grade high on hip MMT to demonstrate improving strength for chores.    Time 6    Period Weeks    Status New    Target Date 10/08/21                   Plan - 09/17/21 1310     Clinical  Impression Statement Patient ambulating with improving mechanics and balance compared to prior sessions. Continued with hip and core strengthening exercises which patient tolerates well. Requires manual cueing for body mechanics with clam exercise secondary to impaired hip abductor strength with quick fatigue. Attempted hip abduction in standing with poor carry over relying on truncal movement/hip flexor strength to complete. Patient will continue to benefit from skilled physical therapy in order to reduce impairment and improve function.    Personal Factors and Comorbidities Past/Current Experience;Fitness;Time since onset of injury/illness/exacerbation    Examination-Activity Limitations Locomotion Level;Transfers;Sleep;Squat;Stairs;Stand;Lift;Bend    Examination-Participation Restrictions Meal Prep;Cleaning;Community Activity;Shop;Volunteer;Pincus Badder [facility]    Stability/Clinical Decision Making Stable/Uncomplicated    Rehab Potential Fair    PT Frequency 2x / week    PT Duration 6 weeks    PT Treatment/Interventions ADLs/Self Care Home Management;Aquatic Therapy;Cryotherapy;Electrical Stimulation;Iontophoresis 4mg /ml Dexamethasone;Moist Heat;Traction;DME Instruction;Gait training;Stair training;Functional mobility training;Therapeutic activities;Therapeutic exercise;Balance training;Patient/family education;Neuromuscular re-education;Orthotic Fit/Training;Manual techniques;Compression bandaging;Manual lymph drainage;Scar mobilization;Passive range of motion;Energy conservation;Dry needling;Splinting;Taping;Spinal Manipulations;Joint Manipulations    PT Next Visit Plan hip strength and core strength, possibly functional strength and possibly gait/balance training    PT Home Exercise Plan bridge, clam 10/13 SKTC, glute set, hip abd iso; 10/18: bent knee raise and supine heel slides.    Consulted and Agree with Plan of Care Patient             Patient will benefit from skilled therapeutic  intervention in order to improve the following deficits and impairments:  Abnormal gait, Difficulty walking, Decreased range of motion, Decreased activity tolerance, Decreased balance, Decreased mobility, Decreased strength, Improper body mechanics, Impaired flexibility, Postural dysfunction, Pain  Visit Diagnosis: Low back pain, unspecified back pain laterality, unspecified chronicity, unspecified whether sciatica present  Muscle weakness (generalized)  Other abnormalities of gait and mobility  Other symptoms and signs involving the  musculoskeletal system     Problem List There are no problems to display for this patient.   1:49 PM, 09/17/21 Wyman Songster PT, DPT Physical Therapist at [facility]   [facility] [address] [place], [place], [address] Phone: [phone]   Fax:  [phone]  Name: SHEDRIC FREDERICKS MRN: [id number] Date of Birth: 1966/02/06

## 2021-09-17 NOTE — Patient Instructions (Signed)
Access Code: [email or web address] URL: [email or web address] Date: 09/17/2021 Prepared by: Greig Castilla Vassie Kugel  Exercises Sit to Stand - 1 x daily - 7 x weekly - 3 sets - 10 reps

## 2021-09-17 NOTE — Telephone Encounter (Signed)
Returned call to client. He states he was not feeling well last Friday and did not make his Neurology appointment. He called number on his paper, but not sure if it was correct. He did leave a message and he did cancel his transportation.  Client provided with [facility] Neurology number so that he can reschedule his appointment.  He will notify me for transportation when rescheduled.   Client is currently receiving PT at [facility] outpatient therapy 2 x per week. Transportation has been arranged previously for 11/1-11/3 and 09/24/21   Will continue to follow and provide resources and support as needed.  Francee Nodal RN Clara Gunn/[facility]

## 2021-09-19 ENCOUNTER — Other Ambulatory Visit: Payer: Self-pay

## 2021-09-19 ENCOUNTER — Ambulatory Visit (HOSPITAL_COMMUNITY): Payer: Self-pay

## 2021-09-19 ENCOUNTER — Encounter (HOSPITAL_COMMUNITY): Payer: Self-pay

## 2021-09-19 DIAGNOSIS — M545 Low back pain, unspecified: Secondary | ICD-10-CM

## 2021-09-19 DIAGNOSIS — R29898 Other symptoms and signs involving the musculoskeletal system: Secondary | ICD-10-CM

## 2021-09-19 DIAGNOSIS — R2689 Other abnormalities of gait and mobility: Secondary | ICD-10-CM

## 2021-09-19 DIAGNOSIS — M6281 Muscle weakness (generalized): Secondary | ICD-10-CM

## 2021-09-19 NOTE — Therapy (Signed)
[facility] [address] [place], [place], [address] Phone: [phone]   Fax:  [phone]  Physical Therapy Treatment  Patient Details  Name: Bradley Mclaughlin MRN: [id number] Date of Birth: 1966-10-31 Referring Provider (PT): Jacquelin Hawking PA-C   Encounter Date: 09/19/2021   PT End of Session - 09/19/21 1010     Visit Number 6    Number of Visits 12    Date for PT Re-Evaluation 10/08/21    Authorization Type financial assistance    PT Start Time 1017    PT Stop Time 1100    PT Time Calculation (min) 43 min    Activity Tolerance Patient tolerated treatment well    Behavior During Therapy [facility] for tasks assessed/performed             Past Medical History:  Diagnosis Date   Bipolar depression (HCC)    Cirrhosis of liver (HCC)     Past Surgical History:  Procedure Laterality Date   FRACTURE SURGERY     femur   HAND SURGERY Right     There were no vitals filed for this visit.   Subjective Assessment - 09/19/21 1010     Subjective Patient reports he woke up in pain this morning and that isn't usual. He pain usually gets progressively worse through the day.    Pertinent History femur fracture and repair in the 1980s; patient reports fatty deposits pushing against his vertebrae; consult with neurologist in January 2023    Limitations Standing;Walking;House hold activities    Currently in Pain? Yes    Pain Score 4     Pain Location Back    Pain Orientation Posterior;Lower;Right;Left    Pain Descriptors / Indicators Aching               [facility] Adult PT Treatment/Exercise - 09/19/21 0001       Lumbar Exercises: Standing   Row Both;10 reps    Theraband Level (Row) Level 3 (Green)    Row Limitations 2 sets    Shoulder Extension Both;10 reps    Theraband Level (Shoulder Extension) Level 3 (Green)    Shoulder Extension Limitations 2 sets    Other Standing Lumbar Exercises tandem stance2x 30 seconds each       Lumbar Exercises: Seated   Sit to Stand 10 reps   seat built up with blue foam   Sit to Stand Limitations STS, cueing for mechanics, eccentric control; 2 sets      Lumbar Exercises: Supine   Clam 10 reps    Clam Limitations cueing for stability, encouraged to complete without HHA and feet hip width apart    Bridge 10 reps    Bridge Limitations partial range, cueing to reduce knee adduction; 2 sets    Other Supine Lumbar Exercises hip abd/add iso with belt and pink ball 10 x 5 sec hold, 2 sets                     PT Education - 09/19/21 1215     Education Details Using pillows for side sleeping.    Person(s) Educated Patient    Methods Explanation    Comprehension Verbalized understanding              PT Short Term Goals - 09/19/21 1010       PT SHORT TERM GOAL #1   Title Patient will be independent with HEP in order to improve functional outcomes.    Time  3    Period Weeks    Status On-going    Target Date 09/17/21      PT SHORT TERM GOAL #2   Title Patient will report at least 25% improvement in symptoms for improved quality of life.    Time 3    Period Weeks    Status On-going    Target Date 09/17/21               PT Long Term Goals - 09/19/21 1011       PT LONG TERM GOAL #1   Title Patient will report at least 75% improvement in symptoms for improved quality of life.    Time 6    Period Weeks    Status On-going      PT LONG TERM GOAL #2   Title Patient will improve FOTO score by at least 10 points in order to indicate improved tolerance to activity.    Time 6    Period Weeks    Status On-going      PT LONG TERM GOAL #3   Title Patient will demonstrate at least 1 grade high on hip MMT to demonstrate improving strength for chores.    Time 6    Period Weeks    Status On-going                   Plan - 09/19/21 1010     Clinical Impression Statement Session focused on core and lower extremity strengthening. Performed supine,  seated and standing exercises. Added blue foam to standard chair height and patient was able to complete 10 sit to stands without UE assist. Cued patient during supine exercises to keep feet and knees hip width apart and not to use UE to assist if at all possible. Added hip adduction isometric.  Patient able to maintain tandem stance balance with left foot in front for 26 seconds without UE assist. Patient will continue to benefit from skilled physical therapy in order to reduce impairment and improve function.    Personal Factors and Comorbidities Past/Current Experience;Fitness;Time since onset of injury/illness/exacerbation    Examination-Activity Limitations Locomotion Level;Transfers;Sleep;Squat;Stairs;Stand;Lift;Bend    Examination-Participation Restrictions Meal Prep;Cleaning;Community Activity;Shop;Volunteer;Pincus Badder [facility]    Stability/Clinical Decision Making Stable/Uncomplicated    Rehab Potential Fair    PT Frequency 2x / week    PT Duration 6 weeks    PT Treatment/Interventions ADLs/Self Care Home Management;Aquatic Therapy;Cryotherapy;Electrical Stimulation;Iontophoresis 4mg /ml Dexamethasone;Moist Heat;Traction;DME Instruction;Gait training;Stair training;Functional mobility training;Therapeutic activities;Therapeutic exercise;Balance training;Patient/family education;Neuromuscular re-education;Orthotic Fit/Training;Manual techniques;Compression bandaging;Manual lymph drainage;Scar mobilization;Passive range of motion;Energy conservation;Dry needling;Splinting;Taping;Spinal Manipulations;Joint Manipulations    PT Next Visit Plan hip strength and core strength, possibly functional strength and possibly gait/balance training    PT Home Exercise Plan bridge, clam 10/13 SKTC, glute set, hip abd iso; 10/18: bent knee raise and supine heel slides.    Consulted and Agree with Plan of Care Patient             Patient will benefit from skilled therapeutic intervention in order to improve  the following deficits and impairments:  Abnormal gait, Difficulty walking, Decreased range of motion, Decreased activity tolerance, Decreased balance, Decreased mobility, Decreased strength, Improper body mechanics, Impaired flexibility, Postural dysfunction, Pain  Visit Diagnosis: Low back pain, unspecified back pain laterality, unspecified chronicity, unspecified whether sciatica present  Muscle weakness (generalized)  Other abnormalities of gait and mobility  Other symptoms and signs involving the musculoskeletal system     Problem List There are no problems to display for  this patient.  Katina Dung. Hartnett-Rands, MS, PT [place] [facility] [place] [phone]  Epifanio Lesches, PT 09/19/2021, 12:21 PM  [facility] [address] [place], [place], [address] Phone: [phone]   Fax:  [phone]  Name: Bradley Mclaughlin MRN: [id number] Date of Birth: 1966/06/20

## 2021-09-24 ENCOUNTER — Encounter (HOSPITAL_COMMUNITY): Payer: Self-pay | Admitting: Physical Therapy

## 2021-09-24 ENCOUNTER — Ambulatory Visit (HOSPITAL_COMMUNITY): Payer: Self-pay | Admitting: Physical Therapy

## 2021-09-24 ENCOUNTER — Other Ambulatory Visit: Payer: Self-pay

## 2021-09-24 DIAGNOSIS — M545 Low back pain, unspecified: Secondary | ICD-10-CM

## 2021-09-24 DIAGNOSIS — R2689 Other abnormalities of gait and mobility: Secondary | ICD-10-CM

## 2021-09-24 DIAGNOSIS — M6281 Muscle weakness (generalized): Secondary | ICD-10-CM

## 2021-09-24 DIAGNOSIS — R29898 Other symptoms and signs involving the musculoskeletal system: Secondary | ICD-10-CM

## 2021-09-24 NOTE — Therapy (Signed)
[facility] [address] [place], [place], [address] Phone: [phone]   Fax:  [phone]  Physical Therapy Treatment  Patient Details  Name: Bradley Mclaughlin MRN: [id number] Date of Birth: [birth year] Referring Provider (PT): Jacquelin Hawking PA-C   Encounter Date: 09/24/2021   PT End of Session - 09/24/21 1104     Visit Number 7    Number of Visits 12    Date for PT Re-Evaluation 10/08/21    Authorization Type financial assistance    PT Start Time 1104   arrives late   PT Stop Time 1125    PT Time Calculation (min) 21 min    Activity Tolerance Patient tolerated treatment well    Behavior During Therapy [facility] for tasks assessed/performed             Past Medical History:  Diagnosis Date   Bipolar depression (HCC)    Cirrhosis of liver (HCC)     Past Surgical History:  Procedure Laterality Date   FRACTURE SURGERY     femur   HAND SURGERY Right     There were no vitals filed for this visit.   Subjective Assessment - 09/24/21 1105     Subjective Feels that walking is better.    Pertinent History femur fracture and repair in the 1980s; patient reports fatty deposits pushing against his vertebrae; consult with neurologist in January 2023    Limitations Standing;Walking;House hold activities    Currently in Pain? No/denies                               [facility] Adult PT Treatment/Exercise - 09/24/21 0001       Lumbar Exercises: Standing   Row Both;15 reps    Theraband Level (Row) Level 3 (Green)    Row Limitations 3 sets    Shoulder Extension Both;15 reps    Theraband Level (Shoulder Extension) Level 3 (Green)    Shoulder Extension Limitations 3 sets      Lumbar Exercises: Seated   Sit to Stand 10 reps    Sit to Stand Limitations STS, cueing for mechanics, eccentric control; 2 sets blue and black foam      Lumbar Exercises: Supine   Bridge 10 reps    Bridge Limitations partial range, cueing to  reduce knee adduction; 2 sets with belt at knees for hip abd activation    Other Supine Lumbar Exercises hip abd/add iso with belt and pink ball 10 x 10 sec hold                     PT Education - 09/24/21 1105     Education Details HEP    Person(s) Educated Patient    Methods Explanation;Demonstration    Comprehension Verbalized understanding;Returned demonstration              PT Short Term Goals - 09/19/21 1010       PT SHORT TERM GOAL #1   Title Patient will be independent with HEP in order to improve functional outcomes.    Time 3    Period Weeks    Status On-going    Target Date 09/17/21      PT SHORT TERM GOAL #2   Title Patient will report at least 25% improvement in symptoms for improved quality of life.    Time 3    Period Weeks    Status On-going  Target Date 09/17/21               PT Long Term Goals - 09/19/21 1011       PT LONG TERM GOAL #1   Title Patient will report at least 75% improvement in symptoms for improved quality of life.    Time 6    Period Weeks    Status On-going      PT LONG TERM GOAL #2   Title Patient will improve FOTO score by at least 10 points in order to indicate improved tolerance to activity.    Time 6    Period Weeks    Status On-going      PT LONG TERM GOAL #3   Title Patient will demonstrate at least 1 grade high on hip MMT to demonstrate improving strength for chores.    Time 6    Period Weeks    Status On-going                   Plan - 09/24/21 1104     Clinical Impression Statement Session limited by patient's late arrival. Continued with core/hip and postural strengthening with supine exercises. Patient with quick fatigue in hip musculature. Relies on momentum with STS without foam and with fatigue secondary to impaired LE strength. Patient will continue to benefit from skilled physical therapy in order to reduce impairment and improve function.    Personal Factors and Comorbidities  Past/Current Experience;Fitness;Time since onset of injury/illness/exacerbation    Examination-Activity Limitations Locomotion Level;Transfers;Sleep;Squat;Stairs;Stand;Lift;Bend    Examination-Participation Restrictions Meal Prep;Cleaning;Community Activity;Shop;Volunteer;Pincus Badder [facility]    Stability/Clinical Decision Making Stable/Uncomplicated    Rehab Potential Fair    PT Frequency 2x / week    PT Duration 6 weeks    PT Treatment/Interventions ADLs/Self Care Home Management;Aquatic Therapy;Cryotherapy;Electrical Stimulation;Iontophoresis 4mg /ml Dexamethasone;Moist Heat;Traction;DME Instruction;Gait training;Stair training;Functional mobility training;Therapeutic activities;Therapeutic exercise;Balance training;Patient/family education;Neuromuscular re-education;Orthotic Fit/Training;Manual techniques;Compression bandaging;Manual lymph drainage;Scar mobilization;Passive range of motion;Energy conservation;Dry needling;Splinting;Taping;Spinal Manipulations;Joint Manipulations    PT Next Visit Plan hip strength and core strength, possibly functional strength and possibly gait/balance training    PT Home Exercise Plan bridge, clam 10/13 SKTC, glute set, hip abd iso; 10/18: bent knee raise and supine heel slides.    Consulted and Agree with Plan of Care Patient             Patient will benefit from skilled therapeutic intervention in order to improve the following deficits and impairments:  Abnormal gait, Difficulty walking, Decreased range of motion, Decreased activity tolerance, Decreased balance, Decreased mobility, Decreased strength, Improper body mechanics, Impaired flexibility, Postural dysfunction, Pain  Visit Diagnosis: Low back pain, unspecified back pain laterality, unspecified chronicity, unspecified whether sciatica present  Muscle weakness (generalized)  Other abnormalities of gait and mobility  Other symptoms and signs involving the musculoskeletal system     Problem  List There are no problems to display for this patient.   11:24 AM, 09/24/21 13/08/22 PT, DPT Physical Therapist at [facility]    [facility] [address] [place], [place], [place] Phone: [phone]   Fax:  [phone]  Name: Bradley Mclaughlin MRN: Tyler Aas Date of Birth: [birth year]

## 2021-09-25 ENCOUNTER — Telehealth: Payer: Self-pay

## 2021-09-25 NOTE — Telephone Encounter (Signed)
Client called His PT appt for 11/10/2 Time was changed and needs to change transportation arrangements to 1130.appt time. [facility] called and per new rule, need 48 hours to make transportation arrangements.  Did make arrangements for appointments to [facility] Out[facility] for the following dates  09/27/21 at 2:30 PM 10/01/21 at  10 am 10/03/21 At 10:45 am 10/07/21 at 10 am 10/09/21 at 10 am 09/27/21 at   9 am [facility].  Client notified that arrangements were made for those dates.  Francee Nodal RN [employer]

## 2021-09-26 ENCOUNTER — Encounter (HOSPITAL_COMMUNITY): Payer: Self-pay | Admitting: Physical Therapy

## 2021-09-27 ENCOUNTER — Encounter (HOSPITAL_COMMUNITY): Payer: Self-pay

## 2021-10-01 ENCOUNTER — Encounter (HOSPITAL_COMMUNITY): Payer: Self-pay | Admitting: Physical Therapy

## 2021-10-03 ENCOUNTER — Ambulatory Visit (HOSPITAL_COMMUNITY): Payer: Self-pay | Admitting: Physical Therapy

## 2021-10-07 ENCOUNTER — Encounter (HOSPITAL_COMMUNITY): Payer: Self-pay | Admitting: Physical Therapy

## 2021-10-09 ENCOUNTER — Encounter (HOSPITAL_COMMUNITY): Payer: Self-pay | Admitting: Physical Therapy

## 2021-10-21 ENCOUNTER — Other Ambulatory Visit (HOSPITAL_COMMUNITY)
Admission: RE | Admit: 2021-10-21 | Discharge: 2021-10-21 | Disposition: A | Discharge status: Home / Self Care | Payer: Self-pay | Source: Ambulatory Visit | Attending: Physician Assistant | Admitting: Physician Assistant

## 2021-10-21 ENCOUNTER — Other Ambulatory Visit: Payer: Self-pay

## 2021-10-21 DIAGNOSIS — R7989 Other specified abnormal findings of blood chemistry: Secondary | ICD-10-CM | POA: Insufficient documentation

## 2021-10-21 LAB — COMPREHENSIVE METABOLIC PANEL
ALT: 58 U/L — ABNORMAL HIGH (ref 0–44)
AST: 58 U/L — ABNORMAL HIGH (ref 15–41)
Albumin: 4.3 g/dL (ref 3.5–5.0)
Alkaline Phosphatase: 53 U/L (ref 38–126)
Anion gap: 7 (ref 5–15)
BUN: 5 mg/dL — ABNORMAL LOW (ref 6–20)
CO2: 29 mmol/L (ref 22–32)
Calcium: 9.1 mg/dL (ref 8.9–10.3)
Chloride: 98 mmol/L (ref 98–111)
Creatinine, Ser: 0.37 mg/dL — ABNORMAL LOW (ref 0.61–1.24)
GFR, Estimated: >60 mL/min (ref >60)
Glucose, Bld: 108 mg/dL — ABNORMAL HIGH (ref 70–99)
Potassium: 3.9 mmol/L (ref 3.5–5.1)
Sodium: 134 mmol/L — ABNORMAL LOW (ref 135–145)
Total Bilirubin: 0.6 mg/dL (ref 0.3–1.2)
Total Protein: 8.2 g/dL — ABNORMAL HIGH (ref 6.5–8.1)

## 2021-10-22 ENCOUNTER — Ambulatory Visit (HOSPITAL_COMMUNITY): Payer: Self-pay | Admitting: Physical Therapy

## 2021-10-23 ENCOUNTER — Telehealth: Payer: Self-pay

## 2021-10-23 NOTE — Telephone Encounter (Signed)
client called requesting transportation assistance to his first appt in [place] for Chronic pain management at [address] [place]. for 89/38/10 arrive at 0930. [facility] called and transport arranged and client notified.  will continue to follow as needed.   Francee Nodal RN [employer]

## 2021-10-24 ENCOUNTER — Ambulatory Visit (HOSPITAL_COMMUNITY): Payer: Self-pay | Attending: Physician Assistant | Admitting: Physical Therapy

## 2021-10-24 ENCOUNTER — Encounter (HOSPITAL_COMMUNITY): Payer: Self-pay | Admitting: Physical Therapy

## 2021-10-24 ENCOUNTER — Other Ambulatory Visit: Payer: Self-pay

## 2021-10-24 DIAGNOSIS — M6281 Muscle weakness (generalized): Secondary | ICD-10-CM | POA: Insufficient documentation

## 2021-10-24 DIAGNOSIS — R29898 Other symptoms and signs involving the musculoskeletal system: Secondary | ICD-10-CM | POA: Insufficient documentation

## 2021-10-24 DIAGNOSIS — M545 Low back pain, unspecified: Secondary | ICD-10-CM | POA: Insufficient documentation

## 2021-10-24 DIAGNOSIS — R2689 Other abnormalities of gait and mobility: Secondary | ICD-10-CM | POA: Insufficient documentation

## 2021-10-24 NOTE — Patient Instructions (Signed)
Access Code: [facility] URL: [email or web address] Date: 10/24/2021 Prepared by: Georges Lynch  Exercises Supine Bridge - 1-2 x daily - 7 x weekly - 2 sets - 10 reps Clamshell - 1-2 x daily - 7 x weekly - 2 sets - 10 reps Supine March - 1-2 x daily - 7 x weekly - 2 sets - 10 reps Small Range Straight Leg Raise - 1-2 x daily - 7 x weekly - 2 sets - 10 reps Supine Isometric Hip Adduction with Pillow at Knees - 1-2 x daily - 7 x weekly - 2 sets - 10 reps - 5 second hold Heel Raises with Counter Support - 1-2 x daily - 7 x weekly - 2 sets - 10 reps Sit to Stand with Arm Reach Toward Target - 1-2 x daily - 7 x weekly - 2 sets - 10 reps Standing Tandem Balance with Counter Support - 1-2 x daily - 7 x weekly - 1 sets - 10 reps - 30 second hold Side Stepping with Counter Support - 1-2 x daily - 7 x weekly - 1 sets - 10 reps

## 2021-10-24 NOTE — Therapy (Signed)
[facility] [address] [place], [place], [address] Phone: [phone]   Fax:  [phone]  Physical Therapy Treatment  Patient Details  Name: Bradley Mclaughlin MRN: [id number] Date of Birth: [birth year] Referring Provider (PT): Soyla Dryer PA-C  PHYSICAL THERAPY DISCHARGE SUMMARY  Visits from Start of Care: 8  Current functional level related to goals / functional outcomes: See below    Remaining deficits: See below    Education / Equipment: See assessment   Patient agrees to discharge. Patient goals were partially met. Patient is being discharged due to being pleased with the current functional level.  Encounter Date: 10/24/2021   PT End of Session - 10/24/21 1021     Visit Number 8    Number of Visits 12    Date for PT Re-Evaluation 10/24/21    Authorization Type financial assistance    PT Start Time 1018    PT Stop Time 1058    PT Time Calculation (min) 40 min    Activity Tolerance Patient tolerated treatment well    Behavior During Therapy WFL for tasks assessed/performed             Past Medical History:  Diagnosis Date   Bipolar depression ([place])    Cirrhosis of liver ([place])     Past Surgical History:  Procedure Laterality Date   FRACTURE SURGERY     femur   HAND SURGERY Right     There were no vitals filed for this visit.   Subjective Assessment - 10/24/21 1021     Subjective Patient says he was out sick the past few weeks. He has been doing exercise as he can. He feels he is walking better. Patient says he feels about 75% improved since starting therapy.    Pertinent History femur fracture and repair in the 1980s; patient reports fatty deposits pushing against his vertebrae; consult with neurologist in January 2023    Limitations Standing;Walking;House hold activities    Currently in Pain? No/denies                [facility] PT Assessment - 10/24/21 0001       Assessment   Medical Diagnosis  Chronic LBP    Referring Provider (PT) Soyla Dryer PA-C    Onset Date/Surgical Date 08/27/01    Next MD Visit January      Precautions   Precautions Fall      Restrictions   Weight Bearing Restrictions No      Balance Screen   Has the patient fallen in the past 6 months No      Prior Function   Level of Independence Independent    Vocation Unemployed      Cognition   Overall Cognitive Status Within Functional Limits for tasks assessed      Observation/Other Assessments   Observations Ambulates with SPC    Focus on Therapeutic Outcomes (FOTO)  38% function   was 54%     Strength   Right Hip Flexion 4+/5   waws 3+   Right Hip Extension 3-/5   was 2+   Right Hip ABduction 3-/5   was 2   Left Hip Flexion 4+/5   was 3+   Left Hip Extension 2+/5    Left Hip ABduction 3-/5   was 2+   Right Knee Extension 4+/5    Left Knee Extension 4+/5    Right Ankle Dorsiflexion 4/5    Left Ankle Dorsiflexion 4/5  [facility] Adult PT Treatment/Exercise - 10/24/21 0001       Lumbar Exercises: Standing   Heel Raises 15 reps    Other Standing Lumbar Exercises sit to stand from eleavted surface x10, semi tandem stance  x 30" each    Other Standing Lumbar Exercises sidestepping at table 4RT      Lumbar Exercises: Supine   Bent Knee Raise 10 reps    Bridge 10 reps    Bridge Limitations partial range, cueing to reduce knee adduction    Straight Leg Raise 10 reps    Other Supine Lumbar Exercises hip adduction iso with pillow 10 x 5"      Lumbar Exercises: Sidelying   Clam Both;10 reps                       PT Short Term Goals - 10/24/21 1031       PT SHORT TERM GOAL #1   Title Patient will be independent with HEP in order to improve functional outcomes.    Baseline Reports variable compliance    Time 3    Period Weeks    Status Partially Met    Target Date 09/17/21      PT SHORT TERM GOAL #2   Title Patient will report at  least 25% improvement in symptoms for improved quality of life.    Baseline Reports 75%    Time 3    Period Weeks    Status Achieved    Target Date 09/17/21               PT Long Term Goals - 10/24/21 1032       PT LONG TERM GOAL #1   Title Patient will report at least 75% improvement in symptoms for improved quality of life.    Baseline Reports 75%    Time 6    Period Weeks    Status Achieved      PT LONG TERM GOAL #2   Title Patient will improve FOTO score by at least 10 points in order to indicate improved tolerance to activity.    Baseline Decreased by 16 points    Time 6    Period Weeks    Status Not Met      PT LONG TERM GOAL #3   Title Patient will demonstrate at least 1 grade high on hip MMT to demonstrate improving strength for chores.    Time 6    Period Weeks    Status Partially Met                   Plan - 10/24/21 1048     Clinical Impression Statement Patient has not been in therapy about a month due to reported sickness. Reassessment performed today. Patient shows greatly improved hip flexion, but remains limited by hip extension and abduction weakness. Patient also demos ataxic movement patterns. Patient does report significant subjective improvement in function despite decreased outcomes reported via FOTO score. Patient demos good static balance and improved ambulation using straight cane. Reviewed and performed comprehensive HEP. Answered all patient questions. Issued updated handout. Patient being DC today due to being pleased with current level of function. Encouraged patient to follow up with therapy services with any further questions or concerns.    Personal Factors and Comorbidities Past/Current Experience;Fitness;Time since onset of injury/illness/exacerbation    Examination-Activity Limitations Locomotion Level;Transfers;Sleep;Squat;Stairs;Stand;Lift;Bend    Examination-Participation Restrictions Meal Prep;Cleaning;Community  Activity;Shop;Volunteer;[facility]    Stability/Clinical Decision Making  Stable/Uncomplicated    Rehab Potential Fair    PT Frequency 2x / week    PT Duration 6 weeks    PT Treatment/Interventions ADLs/Self Care Home Management;Aquatic Therapy;Cryotherapy;Electrical Stimulation;Iontophoresis 66m/ml Dexamethasone;Moist Heat;Traction;DME Instruction;Gait training;Stair training;Functional mobility training;Therapeutic activities;Therapeutic exercise;Balance training;Patient/family education;Neuromuscular re-education;Orthotic Fit/Training;Manual techniques;Compression bandaging;Manual lymph drainage;Scar mobilization;Passive range of motion;Energy conservation;Dry needling;Splinting;Taping;Spinal Manipulations;Joint Manipulations    PT Next Visit Plan DC to HEP    PT Home Exercise Plan bridge, clam 10/13 SKTC, glute set, hip abd iso; 10/18: bent knee raise and supine heel slides.    Consulted and Agree with Plan of Care Patient             Patient will benefit from skilled therapeutic intervention in order to improve the following deficits and impairments:  Abnormal gait, Difficulty walking, Decreased range of motion, Decreased activity tolerance, Decreased balance, Decreased mobility, Decreased strength, Improper body mechanics, Impaired flexibility, Postural dysfunction, Pain  Visit Diagnosis: Low back pain, unspecified back pain laterality, unspecified chronicity, unspecified whether sciatica present  Muscle weakness (generalized)  Other abnormalities of gait and mobility  Other symptoms and signs involving the musculoskeletal system     Problem List There are no problems to display for this patient.  11:00 AM, 10/24/21 CJosue HectorPT DPT  Physical [facility]ist with [facility] [phone] [address]Phone: [phone]  Fax:  [phone] Name: Bradley COLLIGANMRN: [id number] of Birth: 122-Oct-1967

## 2021-11-22 ENCOUNTER — Ambulatory Visit: Payer: Self-pay | Admitting: Neurology

## 2021-12-02 ENCOUNTER — Ambulatory Visit: Payer: Self-pay | Admitting: Physician Assistant

## 2021-12-04 ENCOUNTER — Telehealth: Payer: Self-pay

## 2021-12-04 NOTE — Telephone Encounter (Signed)
[facility] transportation arranged for an appointment to [facility] on 12/09/21 at 11 am. Client notified via text message.client acknowledged receipt.  Bradley Nodal RN [facility]

## 2021-12-09 ENCOUNTER — Other Ambulatory Visit: Payer: Self-pay

## 2021-12-09 ENCOUNTER — Ambulatory Visit: Payer: Self-pay | Admitting: Physician Assistant

## 2021-12-09 ENCOUNTER — Encounter: Payer: Self-pay | Admitting: Physician Assistant

## 2021-12-09 VITALS — BP 119/85 | HR 82 | Temp 97.1°F | Wt 103.0 lb

## 2021-12-09 DIAGNOSIS — G729 Myopathy, unspecified: Secondary | ICD-10-CM

## 2021-12-09 DIAGNOSIS — F17200 Nicotine dependence, unspecified, uncomplicated: Secondary | ICD-10-CM

## 2021-12-09 DIAGNOSIS — F1010 Alcohol abuse, uncomplicated: Secondary | ICD-10-CM

## 2021-12-09 DIAGNOSIS — R7989 Other specified abnormal findings of blood chemistry: Secondary | ICD-10-CM

## 2021-12-09 DIAGNOSIS — Z1211 Encounter for screening for malignant neoplasm of colon: Secondary | ICD-10-CM

## 2021-12-09 DIAGNOSIS — G8929 Other chronic pain: Secondary | ICD-10-CM

## 2021-12-09 DIAGNOSIS — M545 Low back pain, unspecified: Secondary | ICD-10-CM

## 2021-12-09 NOTE — Progress Notes (Unsigned)
No-show excused due to death in family

## 2021-12-09 NOTE — Progress Notes (Signed)
BP 119/85    Pulse 82    Temp (!) 97.1 F (36.2 C)    Wt 103 lb (46.7 kg)    SpO2 97%    BMI 16.13 kg/m    Subjective:    Patient ID: Bradley Mclaughlin, male    DOB: [birth year], 56 y.o.   MRN: [id number]  HPI: Bradley Mclaughlin is a 57 y.o. male presenting on 12/09/2021 for Follow-up   HPI  Pt is 55yoM with chronic LBP and myopathy who has been seen by neurosurgery who recommended PT and neurology evaluation.  He did Physical Therapy for 6 wk and then was "put on hold".  PT notes says pt was completed.    Pt has Neurology appointment scheduled for  12/24/21  He says he went to pain management appointment but was not seen because he didn't have the $10 co-pay.  He says he re-scheduled.  [facility] is [facility].  He is only given trazodone.   He thinks he is going to [facility] due to his alcohol use.  He denies depression, anxiety.  Pt is Still smoking  [name]  He still drinks up to 1/2 case/day.  He doesn't drink daily   Relevant past medical, surgical, family and social history reviewed and updated as indicated. Interim medical history since our last visit reviewed. Allergies and medications reviewed and updated.   Current Outpatient Medications:    loratadine (CLARITIN) 10 MG tablet, Take 10 mg by mouth daily., Disp: , Rfl:    methocarbamol (ROBAXIN) 500 MG tablet, Take 1 tablet (500 mg total) by mouth 3 (three) times daily., Disp: 60 tablet, Rfl: 1   Multiple Vitamins-Minerals (CENTRUM SILVER PO), Take by mouth., Disp: , Rfl:    traZODone (DESYREL) 100 MG tablet, Take 100 mg by mouth at bedtime., Disp: , Rfl:    doxepin (SINEQUAN) 100 MG capsule, Take 100 mg by mouth. (Patient not taking: Reported on 12/09/2021), Disp: , Rfl:    Review of Systems  Per HPI unless specifically indicated above     Objective:    BP 119/85    Pulse 82    Temp (!) 97.1 F (36.2 C)    Wt 103 lb (46.7 kg)    SpO2 97%    BMI 16.13 kg/m   Wt Readings from Last 3  Encounters:  12/09/21 103 lb (46.7 kg)  05/28/21 105 lb 8 oz (47.9 kg)  04/30/21 105 lb 12.8 oz (48 kg)    Physical Exam Vitals reviewed.  Constitutional:      General: He is not in acute distress.    Appearance: He is well-developed. He is not toxic-appearing.     Comments: Appears older than stated age.   Underweight.   Strong odor of cigarette smoke.  HENT:     Head: Normocephalic and atraumatic.  Cardiovascular:     Rate and Rhythm: Normal rate and regular rhythm.  Pulmonary:     Effort: Pulmonary effort is normal.     Breath sounds: Normal breath sounds. No wheezing.  Abdominal:     General: Bowel sounds are normal.     Palpations: Abdomen is soft.     Tenderness: There is no abdominal tenderness.  Musculoskeletal:     Cervical back: Neck supple.  Lymphadenopathy:     Cervical: No cervical adenopathy.  Skin:    General: Skin is warm and dry.  Neurological:     Mental Status: He is alert and  oriented to person, place, and time.  Psychiatric:        Behavior: Behavior normal.          Assessment & Plan:    Encounter Diagnoses  Name Primary?   Chronic right-sided low back pain without sciatica Yes   Myopathy    Screening for colon cancer    Alcohol abuse    Tobacco use disorder    Elevated LFTs       -pt was given another FIT test for colon cancer screening -pt urged to Watch etoh consumption.  He is encouraged to discontinue etoh -encouraged smoking cessation -pt to see Neurology as scheduled -will Check on [facility] financial assistance.  It has not been reviewed yet.  He is encouraged to check again before his appt with neurology -LFTs improved but still higher than normal.  Will continue to monitor -pt to follow up 6 months.  He is to contact office sooner prn

## 2021-12-10 ENCOUNTER — Other Ambulatory Visit: Payer: Self-pay | Admitting: Physician Assistant

## 2021-12-10 DIAGNOSIS — Z1211 Encounter for screening for malignant neoplasm of colon: Secondary | ICD-10-CM

## 2021-12-12 ENCOUNTER — Telehealth: Payer: Self-pay

## 2021-12-12 NOTE — Telephone Encounter (Signed)
Attempted to call client in regards to his CAFA status as well as other referrals needed.   No answer, left Voicemail requesting return call.  Norval Gable RN [employer]

## 2021-12-24 ENCOUNTER — Ambulatory Visit: Payer: Self-pay | Admitting: Neurology

## 2022-01-31 ENCOUNTER — Encounter: Payer: Self-pay | Admitting: Neurology

## 2022-01-31 ENCOUNTER — Ambulatory Visit: Payer: Self-pay | Admitting: Neurology

## 2022-02-04 NOTE — Congregational Nurse Program (Signed)
?  Dept: [phone] ? ? ?Congregational Nurse Program Note ? ?Date of Encounter: 02/04/2022 ? ?Past Medical History: ?Past Medical History:  ?Diagnosis Date  ? Bipolar depression (HCC)   ? Cirrhosis of liver (HCC)   ? ? ?Encounter Details: ? CNP Questionnaire - 02/04/22 1017   ? ?  ? Questionnaire  ? Location Patient Served  [facility]   ? Visit Setting Phone/Text/Email   ? Patient Status Unknown   ? Insurance Uninsured ([place] [employer] Connects/Self-Pay)   ? Insurance Referral [facility]   ? Medication N/A   ? Medical Provider Yes   ? Screening Referrals N/A   ? Medical Referral N/A   ? Medical Appointment Made N/A   ? Food N/A   ? Transportation Provided transportation assistance;Need transportation assistance;Referred to transportation service   ? Housing/Utilities N/A   ? Interpersonal Safety N/A   ? Intervention Educate;Case Management;Advocate;Support   ? ED Visit Averted N/A   ? Life-Saving Intervention Made N/A   ? ?  ?  ? ?  ? ? ?Client called today needing assistance for Transportation on 02/07/22 to [facility] in [place] at 0900. ?Transportation services called and transportation arranged for client.  ?He is working with [employer] SW in regards to applying for disability.  ?Client will also be scheduled for renewal of [employer] Connect and [employer] as an in home visit by [employer] for Thursday 02/06/22 at 10:30 am for renewal.  ? ?Francee Nodal RN ?[facility] ? ?

## 2022-02-07 DIAGNOSIS — F1020 Alcohol dependence, uncomplicated: Secondary | ICD-10-CM | POA: Diagnosis not present

## 2022-03-11 ENCOUNTER — Telehealth: Payer: Self-pay

## 2022-03-11 NOTE — Telephone Encounter (Signed)
Client called regarding need for transportation for 03/24/22 at 0900 to [facility]. ? ?Pick up at [address] [facility] ?Drop off [facility]. ? ?Arranged with [employer] for round trip.  ? ?Francee Nodal RN ?[facility] ?

## 2022-03-12 ENCOUNTER — Telehealth: Payer: Self-pay

## 2022-03-12 NOTE — Telephone Encounter (Signed)
Corene Cornea from [facility] office in [place] left message this morning checking on the status ?of the medical records request they sent to [place] a month ago. Stated that he spoke with someone on 03/03/22 and was told that we had received the request  ?and will send them to him. ? ?Please call [phone] ?

## 2022-03-12 NOTE — Telephone Encounter (Signed)
I called him, LMVM advising done per Release in chart on 03/03/22, and to call back and leave message if I am not available of where to fax to and/or email.  ?

## 2022-03-13 ENCOUNTER — Telehealth: Payer: Self-pay

## 2022-03-13 NOTE — Telephone Encounter (Signed)
Barbara Cower with the Medicaid office in [place] left message saying that they have not received the medical records on this patient.  ?He said the fax number is [phone] and that his number is [phone]. ?I did call him back and had to leave a message for him to call the office. ?

## 2022-03-19 ENCOUNTER — Telehealth: Payer: Self-pay

## 2022-03-19 NOTE — Telephone Encounter (Signed)
Bradley Mclaughlin with disability in [place] left message stating that they have not received any notes from our office yet. ?The alternate fax number [phone] ATTN: Bradley Mclaughlin. His return call number is [phone]. ?

## 2022-03-24 ENCOUNTER — Ambulatory Visit (INDEPENDENT_AMBULATORY_CARE_PROVIDER_SITE_OTHER): Payer: Self-pay | Admitting: Neurology

## 2022-03-24 ENCOUNTER — Encounter: Payer: Self-pay | Admitting: Neurology

## 2022-03-24 VITALS — BP 124/82 | HR 73 | Ht 67.0 in | Wt 100.0 lb

## 2022-03-24 DIAGNOSIS — R29898 Other symptoms and signs involving the musculoskeletal system: Secondary | ICD-10-CM

## 2022-03-24 DIAGNOSIS — M25551 Pain in right hip: Secondary | ICD-10-CM | POA: Diagnosis not present

## 2022-03-24 DIAGNOSIS — R269 Unspecified abnormalities of gait and mobility: Secondary | ICD-10-CM | POA: Diagnosis not present

## 2022-03-24 MED ORDER — DULOXETINE HCL 60 MG PO CPEP: 60.0000 mg | ORAL_CAPSULE | Freq: Every day | ORAL | 11 refills | Status: DC
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
## 2022-03-25 ENCOUNTER — Telehealth: Payer: Self-pay | Admitting: Neurology

## 2022-03-25 LAB — COMPREHENSIVE METABOLIC PANEL
ALT: 69 IU/L — ABNORMAL HIGH (ref 0–44)
AST: 66 IU/L — ABNORMAL HIGH (ref 0–40)
Albumin/Globulin Ratio: 1.5 (ref 1.2–2.2)
Albumin: 4.5 g/dL (ref 3.8–4.9)
Alkaline Phosphatase: 70 IU/L (ref 44–121)
BUN/Creatinine Ratio: 19 (ref 9–20)
BUN: 9 mg/dL (ref 6–24)
Bilirubin Total: 0.5 mg/dL (ref 0.0–1.2)
CO2: 26 mmol/L (ref 20–29)
Calcium: 9.6 mg/dL (ref 8.7–10.2)
Chloride: 96 mmol/L (ref 96–106)
Creatinine, Ser: 0.47 mg/dL — ABNORMAL LOW (ref 0.76–1.27)
Globulin, Total: 3.1 g/dL (ref 1.5–4.5)
Glucose: 103 mg/dL — ABNORMAL HIGH (ref 70–99)
Potassium: 4.2 mmol/L (ref 3.5–5.2)
Sodium: 139 mmol/L (ref 134–144)
Total Protein: 7.6 g/dL (ref 6.0–8.5)
eGFR: 123 mL/min/{1.73_m2} (ref >59)

## 2022-03-25 LAB — CBC WITH DIFFERENTIAL/PLATELET
Basophils Absolute: 0 10*3/uL (ref 0.0–0.2)
Basos: 1 %
EOS (ABSOLUTE): 0.1 10*3/uL (ref 0.0–0.4)
Eos: 2 %
Hematocrit: 45.4 % (ref 37.5–51.0)
Hemoglobin: 16 g/dL (ref 13.0–17.7)
Immature Grans (Abs): 0 10*3/uL (ref 0.0–0.1)
Immature Granulocytes: 0 %
Lymphocytes Absolute: 1.6 10*3/uL (ref 0.7–3.1)
Lymphs: 18 %
MCH: 34.2 pg — ABNORMAL HIGH (ref 26.6–33.0)
MCHC: 35.2 g/dL (ref 31.5–35.7)
MCV: 97 fL (ref 79–97)
Monocytes Absolute: 0.9 10*3/uL (ref 0.1–0.9)
Monocytes: 10 %
Neutrophils Absolute: 6 10*3/uL (ref 1.4–7.0)
Neutrophils: 69 %
Platelets: 307 10*3/uL (ref 150–450)
RBC: 4.68 x10E6/uL (ref 4.14–5.80)
RDW: 12.5 % (ref 11.6–15.4)
WBC: 8.7 10*3/uL (ref 3.4–10.8)

## 2022-03-25 LAB — HIV ANTIBODY (ROUTINE TESTING W REFLEX): HIV Screen 4th Generation wRfx: NONREACTIVE

## 2022-03-25 LAB — RPR: RPR Ser Ql: NONREACTIVE

## 2022-03-25 LAB — TSH: TSH: 1.62 u[IU]/mL (ref 0.450–4.500)

## 2022-03-25 LAB — CK: Total CK: 1599 U/L (ref 41–331)

## 2022-03-25 NOTE — Telephone Encounter (Addendum)
I spoke to the patient. He has [employer]. All tests must be done within [facility] network. States he will need both x-ray and MRI orders sent to [facility]. ? ?He has been scheduled for NCV/EMG on 04/09/22. ?

## 2022-03-25 NOTE — Telephone Encounter (Signed)
I called patient. He reports that he was able to get Cymbalta affordably. He was wondering why sinequan was prescribed. I advised him that we did not prescribe sinequan. He reports that he hasn't taken sinequan in over 20 years and would like it removed from his medication list. ?

## 2022-03-25 NOTE — Telephone Encounter (Signed)
Notes resent attn to [place] this time.  See precious note from 6 days ago below.  ?

## 2022-03-25 NOTE — Telephone Encounter (Signed)
Pt called stating that he can not afford the doxepin (SINEQUAN) 100 MG capsule and is wondering if there is an alternative for it that can go through [employer] [facility] [place]  ?Please advise.  ?

## 2022-03-25 NOTE — Telephone Encounter (Signed)
Please call patient, laboratory evaluation showed significantly elevated CPK level 1599, suggestive of intrinsic muscle disease ? ?Rest of the laboratory evaluation showed no significant abnormalities. ? ?I have ordered x-ray of his right hip, please make sure he had the x-ray done, ? ?EMG/NCS in my next 60 minutes slot, will have repeat blood test when he comes back ?

## 2022-03-25 NOTE — Telephone Encounter (Signed)
self pay order sent to [facility], they will reach out to the patient to schedule.  

## 2022-03-25 NOTE — Telephone Encounter (Signed)
Sent to [facility] per patient he has [facility] financial assistance. Also sent xray order ?

## 2022-03-25 NOTE — Telephone Encounter (Signed)
Both orders faxed to [facility] ?

## 2022-04-02 ENCOUNTER — Telehealth: Payer: Self-pay

## 2022-04-02 NOTE — Telephone Encounter (Signed)
Returned call to client who is requesting transportation for 04/09/22 at 0945 to [facility]. He does not have other transportation. Will assist in arranging transportation. ? ?Client also states he has appointments at [facility] on 04/16/22 for MRI and other x-ray. Requested that client please ask friends or family to possibly assist with this local appointment. He will do so and is to notify this RN if he is unable to arrange . ? ? ?Francee Nodal RN ?[employer] ?

## 2022-04-09 ENCOUNTER — Encounter: Payer: Self-pay | Admitting: Neurology

## 2022-04-09 ENCOUNTER — Ambulatory Visit (INDEPENDENT_AMBULATORY_CARE_PROVIDER_SITE_OTHER): Payer: Self-pay | Admitting: Neurology

## 2022-04-09 VITALS — BP 124/76 | HR 78 | Ht 67.0 in | Wt 100.0 lb

## 2022-04-09 DIAGNOSIS — R269 Unspecified abnormalities of gait and mobility: Secondary | ICD-10-CM
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
## 2022-04-16 ENCOUNTER — Other Ambulatory Visit: Payer: Self-pay | Admitting: Neurology

## 2022-04-16 ENCOUNTER — Ambulatory Visit (HOSPITAL_COMMUNITY)
Admission: RE | Admit: 2022-04-16 | Discharge: 2022-04-16 | Disposition: A | Discharge status: Home / Self Care | Payer: Self-pay | Source: Ambulatory Visit | Attending: Neurology | Admitting: Neurology

## 2022-04-16 DIAGNOSIS — R29898 Other symptoms and signs involving the musculoskeletal system: Secondary | ICD-10-CM

## 2022-04-16 DIAGNOSIS — R269 Unspecified abnormalities of gait and mobility: Secondary | ICD-10-CM

## 2022-04-16 DIAGNOSIS — M25551 Pain in right hip: Secondary | ICD-10-CM | POA: Insufficient documentation

## 2022-04-30 ENCOUNTER — Ambulatory Visit (INDEPENDENT_AMBULATORY_CARE_PROVIDER_SITE_OTHER): Payer: Self-pay | Admitting: Neurology

## 2022-04-30 ENCOUNTER — Telehealth: Payer: Self-pay | Admitting: Neurology

## 2022-04-30 ENCOUNTER — Encounter: Payer: Self-pay | Admitting: Neurology

## 2022-04-30 VITALS — BP 130/88 | HR 68 | Ht 67.0 in | Wt 100.0 lb

## 2022-04-30 DIAGNOSIS — R269 Unspecified abnormalities of gait and mobility: Secondary | ICD-10-CM | POA: Diagnosis not present

## 2022-04-30 DIAGNOSIS — R748 Abnormal levels of other serum enzymes: Secondary | ICD-10-CM | POA: Insufficient documentation

## 2022-04-30 DIAGNOSIS — R29898 Other symptoms and signs involving the musculoskeletal system: Secondary | ICD-10-CM

## 2022-04-30 DIAGNOSIS — M25551 Pain in right hip: Secondary | ICD-10-CM | POA: Diagnosis not present

## 2022-04-30 NOTE — Telephone Encounter (Signed)
Referral for General Surgery sent to [facility] [phone]. 

## 2022-04-30 NOTE — Procedures (Signed)
Full Name: Bradley Mclaughlin Gender: Male MRN #: [id number] Date of Birth: January 05, 1966    Visit Date: 04/30/2022 09:40 Age: 56 Years Examining Physician: Levert Feinstein Referring Physician: Levert Feinstein Height: 5 feet 7 inch History:56 year old male, with history of right hip injury presenting with progressive gait abnormality over the past few years  Summary of the test:  Left sural, superficial peroneal sensory responses were normal.  Left median, ulnar sensory responses showed mildly prolonged peak latency in the setting of cold limb temperature, was well preserved snap amplitude.  Left tibial, peroneal, median, ulnar motor responses were normal.  Electromyography: Selected needle examination was performed at left upper, lower extremity muscles, left cervical and lumbosacral paraspinal muscles.  There is evidence of increased insertional activity, mixture of normal, some polyphasic small motor unit potential with early recruitment, most noticeable at left cervical paraspinal muscles, proximal upper and lower extremity muscles such as deltoid, biceps, iliopsoas muscles.    Conclusion: This is an abnormal study.  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  There is no evidence of large fiber peripheral neuropathy, left cervical or lumbosacral radiculopathy.    ------------------------------- Levert Feinstein M.D. PhD  [facility] [address], [address] [place], [place] [address] Tel: [phone] Fax: [phone]  Verbal informed consent was obtained from the patient, patient was informed of potential risk of procedure, including bruising, bleeding, hematoma formation, infection, muscle weakness, muscle pain, numbness, among others.        MNC    Nerve / Sites Muscle Latency Ref. Amplitude Ref. Rel Amp Segments Distance Velocity Ref. Area    ms ms mV mV %  cm m/s  m/s mVms  L Median - APB     Wrist APB 5.9 ?4.4 6.0 ?4.0 100 Wrist - APB 10   26.3     Upper arm APB 9.3  5.8  97.8 Upper arm - Wrist 22 66 ?49 27.0  L Ulnar - ADM     Wrist ADM 3.5 ?3.3 12.6 ?6.0 100 Wrist - ADM 7   38.4     B.Elbow ADM 6.6  11.7  93 B.Elbow - Wrist 18 57 ?49 36.6     A.Elbow ADM 8.8  11.8  101 A.Elbow - B.Elbow 12 56 ?49 36.9  L Peroneal - EDB     Ankle EDB 6.1 ?6.5 4.8 ?2.0 100 Ankle - EDB 9   19.7     Fib head EDB 13.1  5.0  104 Fib head - Ankle 34 49 ?44 20.6     Pop fossa EDB 16.4  6.3  127 Pop fossa - Fib head 14 43 ?44 19.9         Pop fossa - Ankle      L Tibial - AH     Ankle AH 4.6 ?5.8 14.4 ?4.0 100 Ankle - AH 9   41.5     Pop fossa AH 16.0  13.7  95.1 Pop fossa - Ankle 49 43 ?41 43.8             SNC    Nerve / Sites Rec. Site Peak Lat Ref.  Amp Ref. Segments Distance    ms ms V V  cm  L Sural - Ankle (Calf)     Calf Ankle 4.3 ?4.4 13 ?6 Calf - Ankle 14  L Superficial peroneal - Ankle     Lat leg Ankle 3.3 ?4.4 8 ?  6 Lat leg - Ankle 14  L Median - Orthodromic (Dig II, Mid palm)     Dig II Wrist 3.7 ?3.4 13 ?10 Dig II - Wrist 13  L Ulnar - Orthodromic, (Dig V, Mid palm)     Dig V Wrist 3.3 ?3.1 14 ?5 Dig V - Wrist 42             F  Wave    Nerve F Lat Ref.   ms ms  L Ulnar - ADM 31.5 ?32.0  L Tibial - AH 52.7 ?56.0         EMG Summary Table    Spontaneous MUAP Recruitment  Muscle IA Fib PSW Fasc Other Amp Dur. Poly Pattern  L. Tibialis anterior Increased None None None _______ Normal Normal 1+ Normal  L. Tibialis posterior Normal None None None _______ Normal Normal 1+ Normal  L. Peroneus longus Normal None None None _______ Normal Normal Normal Normal  L. Vastus lateralis Increased None None None _______ Normal Normal 2+ Early  L. Iliopsoas Increased None None None _______ Normal Normal 2+ Early  L. Lumbar paraspinals (low) Normal None None None _______ Normal Normal Normal Normal  L. Lumbar paraspinals (mid) Normal None None None _______  Normal Normal Normal Normal  L. First dorsal interosseous Normal None None None _______ Normal Normal Normal Normal  L. Extensor digitorum communis Normal None None None _______ Normal Normal 1+ Early  L. Biceps brachii Increased None None None _______ Normal Normal 1+ Early  L. Deltoid Normal None None None _______ Normal Normal 1+ Early  L. Triceps brachii Normal None None None _______ Normal Normal 1+ Early  L. Cervical paraspinals Increased None None None _______ Normal Normal 1+ Normal
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
## 2022-05-01 ENCOUNTER — Telehealth: Payer: Self-pay

## 2022-05-01 LAB — RPR: RPR Ser Ql: NONREACTIVE

## 2022-05-01 LAB — HIV ANTIBODY (ROUTINE TESTING W REFLEX): HIV Screen 4th Generation wRfx: NONREACTIVE

## 2022-05-01 NOTE — Telephone Encounter (Signed)
[facility] Lab order sheet has been faxed to [facility] Confirmation received.

## 2022-05-05 ENCOUNTER — Telehealth: Payer: Self-pay | Admitting: Neurology

## 2022-05-05 DIAGNOSIS — R29898 Other symptoms and signs involving the musculoskeletal system: Secondary | ICD-10-CM

## 2022-05-05 DIAGNOSIS — R748 Abnormal levels of other serum enzymes: Secondary | ICD-10-CM

## 2022-05-05 DIAGNOSIS — R269 Unspecified abnormalities of gait and mobility: Secondary | ICD-10-CM

## 2022-05-05 NOTE — Telephone Encounter (Signed)
Please call patient, laboratory evaluation showed significantly increased CPK 3348, LDH, consistent with diagnosis of inflammatory myopathy,  Rest of the laboratory evaluation showed no significant abnormality, please make sure he is on schedule for muscle biopsy  Also follow-up his appointment about 2 weeks after his muscle biopsy.

## 2022-05-05 NOTE — Telephone Encounter (Signed)
Left message for a return call to discuss his lab results.

## 2022-05-06 NOTE — Telephone Encounter (Signed)
Left second message for a return call. 

## 2022-05-06 NOTE — Telephone Encounter (Signed)
I spoke to the patient. He does not have insurance. He has financial assistance set up through [employer] (covers 100% of his bill) and [facility] (covers 90% of his bill).   [facility] called him today but cannot accept his financial assistance.   He needs to be referred to a surgeon within [facility] for his muscle biopsy. If there is no one available, then [facility] would be the next best option. However, depending on the cost of the biopsy, he may not be able to afford even the 10% leftover charge.  Also, he has been taking duloxetine 60mg  daily for more than a month now. The medication is not controlling his pain. He would like an alternative to try.

## 2022-05-07 MED ORDER — GABAPENTIN 300 MG PO CAPS: 900.0000 mg | ORAL_CAPSULE | Freq: Three times a day (TID) | ORAL | 11 refills | Status: DC

## 2022-05-07 NOTE — Addendum Note (Signed)
Addended by: Levert Feinstein on: 05/07/2022 05:56 PM   Modules accepted: Orders

## 2022-05-07 NOTE — Telephone Encounter (Signed)
[facility] 5.0 1 Google review Surgeon in [place], [place] Get online care: [email or web address] Address: [address] [place], [place] [address] Phone: [phone]  I have put in a urgent referral to [facility] surgical associate for right deltoid muscle biopsy for evaluation of possible inflammatory myopathy  Gabapentin up to 300 mg 3 times a day for pain control  Meds ordered this encounter  Medications   gabapentin (NEURONTIN) 300 MG capsule    Sig: Take 3 capsules (900 mg total) by mouth 3 (three) times daily.    Dispense:  270 capsule    Refill:  11

## 2022-05-08 MED ORDER — MELOXICAM 15 MG PO TABS: 15.0000 mg | ORAL_TABLET | Freq: Every day | ORAL | 6 refills | Status: DC

## 2022-05-08 NOTE — Addendum Note (Signed)
Addended by: Lilla Shook on: 05/08/2022 02:33 PM   Modules accepted: Orders

## 2022-05-08 NOTE — Telephone Encounter (Addendum)
From Dr. Terrace Arabia:  Reviewed [facility] medical assistant medication list.   Limited choice, will try Cymbalta 60 mg, meloxicam 15 mg as needed.  Prescriptions sent to the pharmacy.  _____________________________________ I called the patient. Left him a voicemail with the medication plan.

## 2022-05-08 NOTE — Telephone Encounter (Addendum)
I left a detailed message on his voicemail with the information below (ok per DPR). Notified him of the new prescription and told him to expect a call to get the muscle biopsy scheduled. Provided our number to call back with any questions.

## 2022-05-08 NOTE — Telephone Encounter (Signed)
The patient called back. He has no insurance and no access to [facility]. He is now having to get his medications through [facility]. This is a no cost program. I have updated his pharmacy in Epic.   Gabapentin is not available through them. I have downloaded a list from the website: [employer]ch and will provide it to Dr. Terrace Arabia.

## 2022-05-09 ENCOUNTER — Telehealth: Payer: Self-pay | Admitting: Neurology

## 2022-05-09 NOTE — Telephone Encounter (Signed)
Mya from [facility] called stating that the pt is uninsured and that he was not sure if he was going to schedule an appt. She states that they are affiliated with [facility] and do not take [employer] or [facility] assistance. She also states that the pt informed her that he was not aware of the Biopsy that he was to have. Please advise.

## 2022-05-14 LAB — DRUG SCREEN 10 W/CONF, SERUM
Amphetamines, IA: NEGATIVE ng/mL
Barbiturates, IA: NEGATIVE ug/mL
Benzodiazepines, IA: NEGATIVE ng/mL
Methadone, IA: NEGATIVE ng/mL
Opiates, IA: NEGATIVE ng/mL
Oxycodones, IA: NEGATIVE ng/mL
Phencyclidine, IA: NEGATIVE ng/mL
Propoxyphene, IA: NEGATIVE ng/mL
THC(Marijuana) Metabolite, IA: POSITIVE ng/mL — AB

## 2022-05-14 LAB — CK: Total CK: 3348 U/L (ref 41–331)

## 2022-05-14 LAB — ANA W/REFLEX IF POSITIVE: Anti Nuclear Antibody (ANA): NEGATIVE

## 2022-05-14 LAB — THC,MS,WB/SP RFX
Cannabidiol: NEGATIVE ng/mL
Cannabinoid Confirmation: POSITIVE
Carboxy-THC: 109.9 ng/mL
Hydroxy-THC: 2.2 ng/mL
Tetrahydrocannabinol(THC): 9.3 ng/mL

## 2022-05-14 LAB — SEDIMENTATION RATE: Sed Rate: 12 mm/hr (ref 0–30)

## 2022-05-14 LAB — ACETYLCHOLINE RECEPTOR, BINDING: AChR Binding Ab, Serum: 0.03 nmol/L (ref 0.00–0.24)

## 2022-05-14 LAB — COCAINE,MS,WB/SP RFX

## 2022-05-14 LAB — ETHANOL

## 2022-05-14 LAB — LACTATE DEHYDROGENASE: LDH: 306 IU/L — ABNORMAL HIGH (ref 121–224)

## 2022-05-14 LAB — C-REACTIVE PROTEIN: CRP: 5 mg/L (ref 0–10)

## 2022-05-29 ENCOUNTER — Encounter: Payer: Self-pay | Admitting: General Surgery

## 2022-05-29 ENCOUNTER — Ambulatory Visit (INDEPENDENT_AMBULATORY_CARE_PROVIDER_SITE_OTHER): Payer: Self-pay | Admitting: General Surgery

## 2022-05-29 VITALS — BP 123/81 | HR 72 | Temp 98.1°F | Resp 14 | Ht 67.0 in | Wt 92.0 lb

## 2022-05-29 DIAGNOSIS — G7249 Other inflammatory and immune myopathies, not elsewhere classified: Secondary | ICD-10-CM | POA: Insufficient documentation

## 2022-05-29 DIAGNOSIS — M6281 Muscle weakness (generalized): Secondary | ICD-10-CM
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
## 2022-05-29 NOTE — Patient Instructions (Signed)
Muscle Biopsy A muscle biopsy is a procedure in which a tissue sample is removed from a muscle. The tissue is examined under a microscope to help find health problems that involve the muscles. Chemical tests can also be run on the sample if they are needed. A muscle biopsy can be used to diagnose various problems, including: Muscular disorders, such as muscular dystrophy. Infections. Diseases that affect connective tissue or blood vessels. Other conditions that affect the muscle. Tell a health care provider about: Any allergies you have. All medicines you are taking, including vitamins, herbs, eye drops, creams, and over-the-counter medicines. Any problems you or family members have had with anesthetic medicines. Any bleeding problems you have. Any surgeries you have had. Any medical conditions you have. Whether you are pregnant or may be pregnant. What are the risks? Generally, this is a safe procedure. However, problems may occur, including: Infection. Bleeding. Bruising. Problems healing the wound. Injury to the muscle tissue or other tissue near the biopsy site. What happens before the procedure? Medicines Ask your health care provider about: Changing or stopping your regular medicines. This is especially important if you are taking diabetes medicines or blood thinners. Taking medicines such as aspirin and ibuprofen. These medicines can thin your blood. Do not take these medicines unless your health care provider tells you to take them. Taking over-the-counter medicines, vitamins, herbs, and supplements. General instructions Ask your health care provider: How your surgery site will be marked. What steps will be taken to help prevent infection. These steps may include: Removing hair at the surgery site. Washing skin with a germ-killing soap. Taking antibiotic medicine. If you will be going home right after the procedure, plan to have a responsible adult: Take you home from the  hospital or clinic. You will not be allowed to drive. Care for you for the time you are told. What happens during the procedure? An IV may be inserted into a vein in your arm. You may be given: A medicine to numb the area (local anesthetic). A medicine to help you relax (sedative). One of the following methods will be used to remove the tissue sample: Needle biopsy. With this method, a biopsy needle will be inserted into the muscle. The needle will be used to collect the tissue sample. A bandage (dressing) may then be put over the biopsy site. Open biopsy. With this method, a small incision will be made in the skin and muscle. The tissue sample will then be removed using surgical tools. The incision will be closed with skin glue, skin adhesive strips, or stitches (sutures) if needed. The procedure may vary among health care providers and hospitals. What happens after the procedure? Your blood pressure, heart rate, breathing rate, and blood oxygen level will be monitored until you leave the hospital or clinic. If you were given a sedative during the procedure, it can affect you for several hours. Do not drive or operate machinery until your health care provider says that it is safe. You may have soreness and tenderness at the site of the biopsy. This will go away after a few days. Summary A muscle biopsy is a procedure in which a tissue sample is removed from a muscle. A muscle biopsy can be used to diagnose problems in the muscle. Your health care provider will remove the sample using the needle biopsy or open biopsy method. If you will be going home right after the procedure, plan to have a responsible adult take you home from the hospital  or clinic. After the procedure, you may have soreness and tenderness at the site of the biopsy. This information is not intended to replace advice given to you by your health care provider. Make sure you discuss any questions you have with your health care  provider. Document Revised: 09/24/2021 Document Reviewed: 09/24/2021 Elsevier Patient Education  2023 [employer].

## 2022-05-30 DIAGNOSIS — F1020 Alcohol dependence, uncomplicated: Secondary | ICD-10-CM | POA: Diagnosis not present
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
## 2022-05-30 NOTE — Patient Instructions (Signed)
Bradley Mclaughlin  05/30/2022     @PREFPERIOPPHARMACY @   Your procedure is scheduled on  06/05/2022.   Report to [facility] at  0600  A.M.   Call this number if you have problems the morning of surgery:  [phone]   Remember:  Do not eat or drink after midnight.      Take these medicines the morning of surgery with A SIP OF WATER       cymbalta, claritin, mobic(if needed), robaxin (if needed).    Do not wear jewelry, make-up or nail polish.  Do not wear lotions, powders, or perfumes, or deodorant.  Do not shave 48 hours prior to surgery.  Men may shave face and neck.  Do not bring valuables to the hospital.  [facility] is not responsible for any belongings or valuables.  Contacts, dentures or bridgework may not be worn into surgery.  Leave your suitcase in the car.  After surgery it may be brought to your room.  For patients admitted to the hospital, discharge time will be determined by your treatment team.  Patients discharged the day of surgery will not be allowed to drive home and must have someone with them for 24 hours.    Special instructions:   DO NOT smoke tobacco or vape for 24 hours before your procedure.  Please read over the following fact sheets that you were given. Anesthesia Post-op Instructions and Care and Recovery After Surgery      Muscle Biopsy, Care After The following information offers guidance on how to care for yourself after your procedure. Your health care provider may also give you more specific instructions. If you have problems or questions, contact your health care provider. What can I expect after the procedure? After your procedure, it is common to have soreness and tenderness at the site of the biopsy. This may last for a few days. Follow these instructions at home: Biopsy site care  Follow instructions from your health care provider about how to take care of your biopsy site. Make sure you: Wash your hands with soap  and water for at least 20 seconds before and after you change your bandage (dressing). If soap and water are not available, use hand sanitizer. Change your dressing as told by your health care provider. Leave stitches (sutures), skin glue, or adhesive strips in place. These skin closures may need to stay in place for 2 weeks or longer. If adhesive strip edges start to loosen and curl up, you may trim the loose edges. Do not remove adhesive strips completely unless your health care provider tells you to do that. Check your biopsy site every day for signs of infection. Check for: Redness, swelling, or pain. Fluid or blood. Warmth. Pus or a bad smell. Activity If you were given a sedative during the procedure, it can affect you for several hours. Do not drive or operate machinery until your health care provider says that it is safe. Return to your normal activities as told by your health care provider. Ask your health care provider what activities are safe for you. General instructions Take over-the-counter and prescription medicines only as told by your health care provider. Do not take baths, swim, or use a hot tub until your health care provider approves. Ask your health care provider if you may take showers. You may only be allowed to take sponge baths. Keep all follow-up visits. This is important. Contact a health care  provider if: You have redness, swelling, or pain around your biopsy site. You have fluid or blood coming from your biopsy site. Your biopsy site feels warm to the touch. You have pus or a bad smell coming from your biopsy site. You have a fever. You are light-headed, or you feel like you will faint. Get help right away if: You have trouble breathing. You have numbness or tingling going down the arm or leg of your biopsy site. These symptoms may be an emergency. Get help right away. Call 911. Do not wait to see if the symptoms will go away. Do not drive yourself to the  hospital. Summary After your procedure, it is common to have soreness and tenderness at the site of the biopsy. Wash your hands before and after you change your dressing. Leave stitches, skin glue, or adhesive strips in place. Return to your normal activities as told by your health care provider. Contact a health care provider if you have redness, swelling, or pain around your biopsy site. Get help right away if you have trouble breathing or have numbness and tingling down your arm or leg. This information is not intended to replace advice given to you by your health care provider. Make sure you discuss any questions you have with your health care provider. Document Revised: 09/24/2021 Document Reviewed: 09/24/2021 Elsevier Patient Education  2023 [employer] General Anesthesia, Adult, Care After This sheet gives you information about how to care for yourself after your procedure. Your health care provider may also give you more specific instructions. If you have problems or questions, contact your health care provider. What can I expect after the procedure? After the procedure, the following side effects are common: Pain or discomfort at the IV site. Nausea. Vomiting. Sore throat. Trouble concentrating. Feeling cold or chills. Feeling weak or tired. Sleepiness and fatigue. Soreness and body aches. These side effects can affect parts of the body that were not involved in surgery. Follow these instructions at home: For the time period you were told by your health care provider:  Rest. Do not participate in activities where you could fall or become injured. Do not drive or use machinery. Do not drink alcohol. Do not take sleeping pills or medicines that cause drowsiness. Do not make important decisions or sign legal documents. Do not take care of children on your own. Eating and drinking Follow any instructions from your health care provider about eating or drinking  restrictions. When you feel hungry, start by eating small amounts of foods that are soft and easy to digest (bland), such as toast. Gradually return to your regular diet. Drink enough fluid to keep your urine pale yellow. If you vomit, rehydrate by drinking water, juice, or clear broth. General instructions If you have sleep apnea, surgery and certain medicines can increase your risk for breathing problems. Follow instructions from your health care provider about wearing your sleep device: Anytime you are sleeping, including during daytime naps. While taking prescription pain medicines, sleeping medicines, or medicines that make you drowsy. Have a responsible adult stay with you for the time you are told. It is important to have someone help care for you until you are awake and alert. Return to your normal activities as told by your health care provider. Ask your health care provider what activities are safe for you. Take over-the-counter and prescription medicines only as told by your health care provider. If you smoke, do not smoke without supervision. Keep all follow-up visits as  told by your health care provider. This is important. Contact a health care provider if: You have nausea or vomiting that does not get better with medicine. You cannot eat or drink without vomiting. You have pain that does not get better with medicine. You are unable to pass urine. You develop a skin rash. You have a fever. You have redness around your IV site that gets worse. Get help right away if: You have difficulty breathing. You have chest pain. You have blood in your urine or stool, or you vomit blood. Summary After the procedure, it is common to have a sore throat or nausea. It is also common to feel tired. Have a responsible adult stay with you for the time you are told. It is important to have someone help care for you until you are awake and alert. When you feel hungry, start by eating small amounts  of foods that are soft and easy to digest (bland), such as toast. Gradually return to your regular diet. Drink enough fluid to keep your urine pale yellow. Return to your normal activities as told by your health care provider. Ask your health care provider what activities are safe for you. This information is not intended to replace advice given to you by your health care provider. Make sure you discuss any questions you have with your health care provider. Document Revised: 07/19/2020 Document Reviewed: 02/16/2020 Elsevier Patient Education  2023 Elsevier Inc. How to Use Chlorhexidine for Bathing Chlorhexidine gluconate (CHG) is a germ-killing (antiseptic) solution that is used to clean the skin. It can get rid of the bacteria that normally live on the skin and can keep them away for about 24 hours. To clean your skin with CHG, you may be given: A CHG solution to use in the shower or as part of a sponge bath. A prepackaged cloth that contains CHG. Cleaning your skin with CHG may help lower the risk for infection: While you are staying in the intensive care unit of the hospital. If you have a vascular access, such as a central line, to provide short-term or long-term access to your veins. If you have a catheter to drain urine from your bladder. If you are on a ventilator. A ventilator is a machine that helps you breathe by moving air in and out of your lungs. After surgery. What are the risks? Risks of using CHG include: A skin reaction. Hearing loss, if CHG gets in your ears and you have a perforated eardrum. Eye injury, if CHG gets in your eyes and is not rinsed out. The CHG product catching fire. Make sure that you avoid smoking and flames after applying CHG to your skin. Do not use CHG: If you have a chlorhexidine allergy or have previously reacted to chlorhexidine. On babies younger than 65 months of age. How to use CHG solution Use CHG only as told by your health care provider, and  follow the instructions on the label. Use the full amount of CHG as directed. Usually, this is one bottle. During a shower Follow these steps when using CHG solution during a shower (unless your health care provider gives you different instructions): Start the shower. Use your normal soap and shampoo to wash your face and hair. Turn off the shower or move out of the shower stream. Pour the CHG onto a clean washcloth. Do not use any type of brush or rough-edged sponge. Starting at your neck, lather your body down to your toes. Make sure you follow  these instructions: If you will be having surgery, pay special attention to the part of your body where you will be having surgery. Scrub this area for at least 1 minute. Do not use CHG on your head or face. If the solution gets into your ears or eyes, rinse them well with water. Avoid your genital area. Avoid any areas of skin that have broken skin, cuts, or scrapes. Scrub your back and under your arms. Make sure to wash skin folds. Let the lather sit on your skin for 1-2 minutes or as long as told by your health care provider. Thoroughly rinse your entire body in the shower. Make sure that all body creases and crevices are rinsed well. Dry off with a clean towel. Do not put any substances on your body afterward--such as powder, lotion, or perfume--unless you are told to do so by your health care provider. Only use lotions that are recommended by the manufacturer. Put on clean clothes or pajamas. If it is the night before your surgery, sleep in clean sheets.  During a sponge bath Follow these steps when using CHG solution during a sponge bath (unless your health care provider gives you different instructions): Use your normal soap and shampoo to wash your face and hair. Pour the CHG onto a clean washcloth. Starting at your neck, lather your body down to your toes. Make sure you follow these instructions: If you will be having surgery, pay special  attention to the part of your body where you will be having surgery. Scrub this area for at least 1 minute. Do not use CHG on your head or face. If the solution gets into your ears or eyes, rinse them well with water. Avoid your genital area. Avoid any areas of skin that have broken skin, cuts, or scrapes. Scrub your back and under your arms. Make sure to wash skin folds. Let the lather sit on your skin for 1-2 minutes or as long as told by your health care provider. Using a different clean, wet washcloth, thoroughly rinse your entire body. Make sure that all body creases and crevices are rinsed well. Dry off with a clean towel. Do not put any substances on your body afterward--such as powder, lotion, or perfume--unless you are told to do so by your health care provider. Only use lotions that are recommended by the manufacturer. Put on clean clothes or pajamas. If it is the night before your surgery, sleep in clean sheets. How to use CHG prepackaged cloths Only use CHG cloths as told by your health care provider, and follow the instructions on the label. Use the CHG cloth on clean, dry skin. Do not use the CHG cloth on your head or face unless your health care provider tells you to. When washing with the CHG cloth: Avoid your genital area. Avoid any areas of skin that have broken skin, cuts, or scrapes. Before surgery Follow these steps when using a CHG cloth to clean before surgery (unless your health care provider gives you different instructions): Using the CHG cloth, vigorously scrub the part of your body where you will be having surgery. Scrub using a back-and-forth motion for 3 minutes. The area on your body should be completely wet with CHG when you are done scrubbing. Do not rinse. Discard the cloth and let the area air-dry. Do not put any substances on the area afterward, such as powder, lotion, or perfume. Put on clean clothes or pajamas. If it is the night before your surgery,  sleep  in clean sheets.  For general bathing Follow these steps when using CHG cloths for general bathing (unless your health care provider gives you different instructions). Use a separate CHG cloth for each area of your body. Make sure you wash between any folds of skin and between your fingers and toes. Wash your body in the following order, switching to a new cloth after each step: The front of your neck, shoulders, and chest. Both of your arms, under your arms, and your hands. Your stomach and groin area, avoiding the genitals. Your right leg and foot. Your left leg and foot. The back of your neck, your back, and your buttocks. Do not rinse. Discard the cloth and let the area air-dry. Do not put any substances on your body afterward--such as powder, lotion, or perfume--unless you are told to do so by your health care provider. Only use lotions that are recommended by the manufacturer. Put on clean clothes or pajamas. Contact a health care provider if: Your skin gets irritated after scrubbing. You have questions about using your solution or cloth. You swallow any chlorhexidine. Call your local poison control center [phone] in the [place] Get help right away if: Your eyes itch badly, or they become very red or swollen. Your skin itches badly and is red or swollen. Your hearing changes. You have trouble seeing. You have swelling or tingling in your mouth or throat. You have trouble breathing. These symptoms may represent a serious problem that is an emergency. Do not wait to see if the symptoms will go away. Get medical help right away. Call your local emergency services (911 in the [place] Do not drive yourself to the hospital. Summary Chlorhexidine gluconate (CHG) is a germ-killing (antiseptic) solution that is used to clean the skin. Cleaning your skin with CHG may help to lower your risk for infection. You may be given CHG to use for bathing. It may be in a bottle or in a prepackaged  cloth to use on your skin. Carefully follow your health care provider's instructions and the instructions on the product label. Do not use CHG if you have a chlorhexidine allergy. Contact your health care provider if your skin gets irritated after scrubbing. This information is not intended to replace advice given to you by your health care provider. Make sure you discuss any questions you have with your health care provider. Document Revised: 01/14/2021 Document Reviewed: 01/14/2021 Elsevier Patient Education  2023 [employer].

## 2022-06-02 ENCOUNTER — Encounter (HOSPITAL_COMMUNITY)
Admission: RE | Admit: 2022-06-02 | Discharge: 2022-06-02 | Disposition: A | Discharge status: Home / Self Care | Payer: Self-pay | Source: Ambulatory Visit | Attending: General Surgery | Admitting: General Surgery

## 2022-06-02 VITALS — HR 73 | Temp 98.1°F | Resp 16 | Ht 67.0 in | Wt 92.0 lb

## 2022-06-02 DIAGNOSIS — K7460 Unspecified cirrhosis of liver: Secondary | ICD-10-CM | POA: Insufficient documentation

## 2022-06-02 DIAGNOSIS — F17200 Nicotine dependence, unspecified, uncomplicated: Secondary | ICD-10-CM | POA: Insufficient documentation

## 2022-06-02 DIAGNOSIS — Z01818 Encounter for other preprocedural examination: Secondary | ICD-10-CM | POA: Insufficient documentation

## 2022-06-02 LAB — PROTIME-INR
INR: 1.1 (ref 0.8–1.2)
Prothrombin Time: 14.1 seconds (ref 11.4–15.2)

## 2022-06-05 ENCOUNTER — Ambulatory Visit (HOSPITAL_COMMUNITY)
Admission: RE | Admit: 2022-06-05 | Discharge: 2022-06-05 | Disposition: A | Discharge status: Home / Self Care | Payer: Self-pay | Attending: General Surgery | Admitting: General Surgery

## 2022-06-05 ENCOUNTER — Encounter (HOSPITAL_COMMUNITY)
Admission: RE | Disposition: A | Discharge status: Home / Self Care | Payer: Self-pay | Source: Home / Self Care | Attending: General Surgery

## 2022-06-05 ENCOUNTER — Encounter (HOSPITAL_COMMUNITY): Payer: Self-pay | Admitting: General Surgery

## 2022-06-05 ENCOUNTER — Other Ambulatory Visit: Payer: Self-pay

## 2022-06-05 ENCOUNTER — Ambulatory Visit (HOSPITAL_BASED_OUTPATIENT_CLINIC_OR_DEPARTMENT_OTHER): Payer: Self-pay | Admitting: Certified Registered Nurse Anesthetist

## 2022-06-05 ENCOUNTER — Ambulatory Visit (HOSPITAL_COMMUNITY): Payer: Self-pay | Admitting: Certified Registered Nurse Anesthetist

## 2022-06-05 DIAGNOSIS — K7460 Unspecified cirrhosis of liver: Secondary | ICD-10-CM

## 2022-06-05 DIAGNOSIS — G709 Myoneural disorder, unspecified: Secondary | ICD-10-CM

## 2022-06-05 DIAGNOSIS — G8929 Other chronic pain: Secondary | ICD-10-CM | POA: Insufficient documentation

## 2022-06-05 DIAGNOSIS — F319 Bipolar disorder, unspecified: Secondary | ICD-10-CM | POA: Insufficient documentation

## 2022-06-05 DIAGNOSIS — G7249 Other inflammatory and immune myopathies, not elsewhere classified: Secondary | ICD-10-CM

## 2022-06-05 DIAGNOSIS — M6281 Muscle weakness (generalized): Secondary | ICD-10-CM

## 2022-06-05 DIAGNOSIS — F17210 Nicotine dependence, cigarettes, uncomplicated: Secondary | ICD-10-CM

## 2022-06-05 DIAGNOSIS — G729 Myopathy, unspecified: Secondary | ICD-10-CM | POA: Insufficient documentation

## 2022-06-05 HISTORY — PX: MUSCLE BIOPSY: SHX716

## 2022-06-05 SURGERY — MUSCLE BIOPSY
Anesthesia: General | Site: Arm Upper | Laterality: Left

## 2022-06-05 MED ORDER — DEXAMETHASONE SODIUM PHOSPHATE 10 MG/ML IJ SOLN
INTRAMUSCULAR | Status: AC
  Filled 2022-06-05: qty 1

## 2022-06-05 MED ORDER — ONDANSETRON HCL 4 MG/2ML IJ SOLN
INTRAMUSCULAR | Status: DC | PRN
  Administered 2022-06-05: 4 mg via INTRAVENOUS

## 2022-06-05 MED ORDER — CHLORHEXIDINE GLUCONATE CLOTH 2 % EX PADS: 6.0000 | MEDICATED_PAD | Freq: Once | CUTANEOUS | Status: DC

## 2022-06-05 MED ORDER — ONDANSETRON HCL 4 MG/2ML IJ SOLN
INTRAMUSCULAR | Status: AC
  Filled 2022-06-05: qty 2

## 2022-06-05 MED ORDER — LIDOCAINE HCL (CARDIAC) PF 100 MG/5ML IV SOSY
PREFILLED_SYRINGE | INTRAVENOUS | Status: DC | PRN
  Administered 2022-06-05: 40 mg via INTRAVENOUS

## 2022-06-05 MED ORDER — CHLORHEXIDINE GLUCONATE 0.12 % MT SOLN
15.0000 mL | Freq: Once | OROMUCOSAL | Status: AC
Start: 2022-06-05 — End: 2022-06-05
  Administered 2022-06-05: 15 mL via OROMUCOSAL

## 2022-06-05 MED ORDER — PROPOFOL 500 MG/50ML IV EMUL
INTRAVENOUS | Status: AC
  Filled 2022-06-05: qty 50

## 2022-06-05 MED ORDER — LIDOCAINE HCL (PF) 1 % IJ SOLN
INTRAMUSCULAR | Status: AC
  Filled 2022-06-05: qty 30

## 2022-06-05 MED ORDER — PROPOFOL 500 MG/50ML IV EMUL
INTRAVENOUS | Status: DC | PRN
  Administered 2022-06-05: 250 ug/kg/min via INTRAVENOUS

## 2022-06-05 MED ORDER — LACTATED RINGERS IV SOLN
INTRAVENOUS | Status: DC
Start: 2022-06-05 — End: 2022-06-05

## 2022-06-05 MED ORDER — PROPOFOL 10 MG/ML IV BOLUS
INTRAVENOUS | Status: DC | PRN
  Administered 2022-06-05: 200 mg via INTRAVENOUS

## 2022-06-05 MED ORDER — LIDOCAINE HCL (PF) 1 % IJ SOLN
INTRAMUSCULAR | Status: DC | PRN
  Administered 2022-06-05: 4 mL

## 2022-06-05 MED ORDER — BUPIVACAINE LIPOSOME 1.3 % IJ SUSP
INTRAMUSCULAR | Status: AC
  Filled 2022-06-05: qty 20

## 2022-06-05 MED ORDER — CEFAZOLIN SODIUM-DEXTROSE 2-4 GM/100ML-% IV SOLN
2.0000 g | INTRAVENOUS | Status: AC
Start: 2022-06-05 — End: 2022-06-05
  Administered 2022-06-05: 2 g via INTRAVENOUS
  Filled 2022-06-05: qty 100

## 2022-06-05 MED ORDER — ONDANSETRON HCL 4 MG/2ML IJ SOLN
4.0000 mg | Freq: Once | INTRAMUSCULAR | Status: DC | PRN
Start: 2022-06-05 — End: 2022-06-05

## 2022-06-05 MED ORDER — MIDAZOLAM HCL 5 MG/5ML IJ SOLN
INTRAMUSCULAR | Status: DC | PRN
  Administered 2022-06-05: 2 mg via INTRAVENOUS

## 2022-06-05 MED ORDER — LIDOCAINE HCL (PF) 2 % IJ SOLN
INTRAMUSCULAR | Status: AC
  Filled 2022-06-05: qty 5

## 2022-06-05 MED ORDER — 0.9 % SODIUM CHLORIDE (POUR BTL) OPTIME
TOPICAL | Status: DC | PRN
  Administered 2022-06-05: 1000 mL

## 2022-06-05 MED ORDER — FENTANYL CITRATE (PF) 100 MCG/2ML IJ SOLN
INTRAMUSCULAR | Status: AC
  Filled 2022-06-05: qty 2

## 2022-06-05 MED ORDER — PROPOFOL 10 MG/ML IV BOLUS
INTRAVENOUS | Status: AC
  Filled 2022-06-05: qty 40

## 2022-06-05 MED ORDER — MIDAZOLAM HCL 2 MG/2ML IJ SOLN
INTRAMUSCULAR | Status: AC
  Filled 2022-06-05: qty 2

## 2022-06-05 MED ORDER — BUPIVACAINE HCL (PF) 0.5 % IJ SOLN
INTRAMUSCULAR | Status: AC
  Filled 2022-06-05: qty 30

## 2022-06-05 MED ORDER — ORAL CARE MOUTH RINSE: 15.0000 mL | Freq: Once | OROMUCOSAL | Status: AC

## 2022-06-05 MED ORDER — HYDROMORPHONE HCL 1 MG/ML IJ SOLN: 0.2500 mg | INTRAMUSCULAR | Status: DC | PRN

## 2022-06-05 MED ORDER — BUPIVACAINE LIPOSOME 1.3 % IJ SUSP
INTRAMUSCULAR | Status: DC | PRN
  Administered 2022-06-05: 20 mL

## 2022-06-05 SURGICAL SUPPLY — 34 items
ADH SKN CLS APL DERMABOND .7 (GAUZE/BANDAGES/DRESSINGS) ×1
APL PRP STRL LF ISPRP CHG 10.5 (MISCELLANEOUS) ×1
APPLICATOR CHLORAPREP 10.5 ORG (MISCELLANEOUS) ×2 IMPLANT
CLOTH BEACON ORANGE TIMEOUT ST (SAFETY) ×2 IMPLANT
CNTNR SPEC C3OZ STD GRAD LEK (MISCELLANEOUS) IMPLANT
CONT SPEC 3OZ W/LID STRL (MISCELLANEOUS) ×2
COVER LIGHT HANDLE STERIS (MISCELLANEOUS) ×4 IMPLANT
DECANTER SPIKE VIAL GLASS SM (MISCELLANEOUS) ×2 IMPLANT
DERMABOND ADVANCED (GAUZE/BANDAGES/DRESSINGS) ×1
DERMABOND ADVANCED .7 DNX12 (GAUZE/BANDAGES/DRESSINGS) IMPLANT
ELECT REM PT RETURN 9FT ADLT (ELECTROSURGICAL) ×2
ELECTRODE REM PT RTRN 9FT ADLT (ELECTROSURGICAL) ×1 IMPLANT
GAUZE SPONGE 4X4 12PLY STRL (GAUZE/BANDAGES/DRESSINGS) ×1 IMPLANT
GLOVE BIO SURGEON STRL SZ 6.5 (GLOVE) ×2 IMPLANT
GLOVE BIOGEL PI IND STRL 6.5 (GLOVE) ×1 IMPLANT
GLOVE BIOGEL PI IND STRL 7.0 (GLOVE) ×2 IMPLANT
GLOVE BIOGEL PI INDICATOR 6.5 (GLOVE) ×1
GLOVE BIOGEL PI INDICATOR 7.0 (GLOVE) ×2
GOWN STRL REUS W/TWL LRG LVL3 (GOWN DISPOSABLE) ×4 IMPLANT
KIT TURNOVER KIT A (KITS) ×2 IMPLANT
MANIFOLD NEPTUNE II (INSTRUMENTS) ×2 IMPLANT
NDL HYPO 25X1 1.5 SAFETY (NEEDLE) ×1 IMPLANT
NEEDLE HYPO 25X1 1.5 SAFETY (NEEDLE) ×2 IMPLANT
NS IRRIG 1000ML POUR BTL (IV SOLUTION) ×2 IMPLANT
PACK MINOR (CUSTOM PROCEDURE TRAY) IMPLANT
PAD ARMBOARD 7.5X6 YLW CONV (MISCELLANEOUS) ×2 IMPLANT
SET BASIN LINEN APH (SET/KITS/TRAYS/PACK) ×2 IMPLANT
SUT ETHILON 3 0 FSL (SUTURE) IMPLANT
SUT MNCRL AB 4-0 PS2 18 (SUTURE) ×2 IMPLANT
SUT PROLENE 4 0 PS 2 18 (SUTURE) IMPLANT
SUT VIC AB 3-0 SH 27 (SUTURE)
SUT VIC AB 3-0 SH 27X BRD (SUTURE) IMPLANT
SYR 20ML LL LF (SYRINGE) ×1 IMPLANT
SYR CONTROL 10ML LL (SYRINGE) ×2 IMPLANT

## 2022-06-05 NOTE — Op Note (Signed)
[facility] Operative Note  06/05/22  Preoperative Diagnosis: Muscle weakness, concern for myopathy    Postoperative Diagnosis: Same   Procedure(s) Performed:  Left deltoid muscle biopsy    Surgeon: Leatrice Jewels. Henreitta Leber, MD   Assistants: No qualified resident was available    Anesthesia: General endotracheal   Anesthesiologist: Dr. Alva Garnet   Specimens: Muscle biopsy left deltoid   Estimated Blood Loss: Minimal   Blood Replacement: None    Complications: None    Wound Class: Clean   Operative Indications: Mr. Hu is a 56 yo with a history of muscles weakness and concern for myopathy and need for muscle biopsy. We discussed open biopsy and risk of bleeding, infection, non diagnostic findings. The operating room discussed with pathology the biopsy and pathology said they did not need the specimen fixated. They asked that the specimen be 2 cm and come wrapped in saline dampened gauze early in the AM for first transport.   Findings: Normal appearing muscle    Procedure: The patient was taken to the operating room and placed supine. General anesthesia with LMA was induced. Intravenous antibiotics were administered per protocol.  The left upper arm was prepared and draped in the usual sterile fashion.   5cc of lidocaine was injected. A vertical incision was made over the medial deltoid and carried down to the muscle. Skin flaps were created. The fascia was opened with scissors. Using blunt dissection and 2 cm portion of muscle was clamped with hemostats and cut for the specimen. This retracted since no clamp or fixation was left in place (but this was at the instruction of pathology). I took a second more deep specimen in the same manor given the retraction to ensure adequate tissue.   The muscle edges were made hemostatic with cautery. The fascia was closed with 3-0 Vicryl interrupted. Exparel was injected for longterm analgesia.  The skin was closed with 4-0  Monocryl and dermabond.   Final inspection revealed acceptable hemostasis. All counts were correct at the end of the case. The patient was awakened from anesthesia and extubated without complication.  The patient went to the PACU in stable condition.   Algis Greenhouse, MD [facility] [address] [place], [place] [address] [phone] (office)

## 2022-06-05 NOTE — Interval H&P Note (Signed)
History and Physical Interval Note:  06/05/2022 7:19 AM  Bradley Mclaughlin  has presented today for surgery, with the diagnosis of MUSCLE WEAKNESS INFLAMMTORY MYOPATHY.  The various methods of treatment have been discussed with the patient and family. After consideration of risks, benefits and other options for treatment, the patient has consented to  Procedure(s): MUSCLE BIOPSY, DELTOID, DEEP (Left) as a surgical intervention.  The patient's history has been reviewed, patient examined, no change in status, stable for surgery.  I have reviewed the patient's chart and labs.  Questions were answered to the patient's satisfaction.     Lucretia Roers

## 2022-06-05 NOTE — Anesthesia Preprocedure Evaluation (Addendum)
Anesthesia Evaluation  Patient identified by MRN, date of birth, ID band Patient awake    Reviewed: Allergy & Precautions, NPO status , Patient's Chart, lab work & pertinent test results  Airway Mallampati: II  TM Distance: >3 FB Neck ROM: Full    Dental  (+) Dental Advisory Given, Loose, Poor Dentition, Missing,    Pulmonary Current Smoker,    Pulmonary exam normal breath sounds clear to auscultation       Cardiovascular negative cardio ROS Normal cardiovascular exam Rhythm:Regular Rate:Normal     Neuro/Psych PSYCHIATRIC DISORDERS Bipolar Disorder  Neuromuscular disease (inflammatory myopathy)    GI/Hepatic negative GI ROS, (+) Cirrhosis     substance abuse  marijuana use,   Endo/Other  negative endocrine ROS  Renal/GU negative Renal ROS  negative genitourinary   Musculoskeletal negative musculoskeletal ROS (+)   Abdominal   Peds negative pediatric ROS (+)  Hematology negative hematology ROS (+)   Anesthesia Other Findings   Reproductive/Obstetrics negative OB ROS                           Anesthesia Physical Anesthesia Plan  ASA: 3  Anesthesia Plan: General   Post-op Pain Management: Dilaudid IV   Induction: Intravenous  PONV Risk Score and Plan: 2 and Ondansetron and Dexamethasone  Airway Management Planned: LMA  Additional Equipment:   Intra-op Plan:   Post-operative Plan: Extubation in OR  Informed Consent: I have reviewed the patients History and Physical, chart, labs and discussed the procedure including the risks, benefits and alternatives for the proposed anesthesia with the patient or authorized representative who has indicated his/her understanding and acceptance.       Plan Discussed with: CRNA and Surgeon  Anesthesia Plan Comments: (Will avoid succinyl choline for paralysis.)       Anesthesia Quick Evaluation

## 2022-06-05 NOTE — Anesthesia Postprocedure Evaluation (Signed)
Anesthesia Post Note  Patient: Bradley Mclaughlin  Procedure(s) Performed: MUSCLE BIOPSY, DELTOID, DEEP (Left: Arm Upper)  Patient location during evaluation: Phase II Anesthesia Type: General Level of consciousness: awake and alert and oriented Pain management: pain level controlled Vital Signs Assessment: post-procedure vital signs reviewed and stable Respiratory status: spontaneous breathing, nonlabored ventilation and respiratory function stable Cardiovascular status: blood pressure returned to baseline and stable Postop Assessment: no apparent nausea or vomiting Anesthetic complications: no   No notable events documented.   Last Vitals:  Vitals:   06/05/22 0830 06/05/22 0841  BP: 99/67 118/81  Pulse:  69  Resp: 13 18  Temp:  37.1 C  SpO2: 100% 97%    Last Pain:  Vitals:   06/05/22 0841  TempSrc: Oral  PainSc: 9                  Deundra Furber C Callie Facey

## 2022-06-05 NOTE — Progress Notes (Signed)
Waiting with girlfriend for cab to arrive, sitting on bench drinking coffee.

## 2022-06-05 NOTE — Transfer of Care (Signed)
Immediate Anesthesia Transfer of Care Note  Patient: Bradley Mclaughlin  Procedure(s) Performed: MUSCLE BIOPSY, DELTOID, DEEP (Left: Arm Upper)  Patient Location: PACU  Anesthesia Type:General  Level of Consciousness: awake, alert  and oriented  Airway & Oxygen Therapy: Patient Spontanous Breathing  Post-op Assessment: Report given to RN, Post -op Vital signs reviewed and stable, Patient moving all extremities X 4 and Patient able to stick tongue midline  Post vital signs: Reviewed  Last Vitals:  Vitals Value Taken Time  BP 113/74 06/05/22 0819  Temp 98.4   Pulse 98 06/05/22 0818  Resp 21 06/05/22 0820  SpO2 94 % 06/05/22 0818  Vitals shown include unvalidated device data.  Last Pain:  Vitals:   06/05/22 0657  PainSc: 6          Complications: No notable events documented.

## 2022-06-05 NOTE — Progress Notes (Addendum)
[facility]  Updated family. Muscle biopsy sent straight to pathology per their specifications.   Algis Greenhouse, MD [facility] [address] [place], [place] [address] [phone] (office)

## 2022-06-05 NOTE — Anesthesia Procedure Notes (Signed)
Procedure Name: LMA Insertion Date/Time: 06/05/2022 7:34 AM  Performed by: Cy Blamer, CRNAPre-anesthesia Checklist: Patient identified, Emergency Drugs available, Suction available and Patient being monitored Patient Re-evaluated:Patient Re-evaluated prior to induction Oxygen Delivery Method: Circle system utilized Preoxygenation: Pre-oxygenation with 100% oxygen Induction Type: IV induction Ventilation: Mask ventilation without difficulty LMA: LMA inserted LMA Size: 5.0 Number of attempts: 1 Placement Confirmation: positive ETCO2 and breath sounds checked- equal and bilateral Tube secured with: Tape Dental Injury: Teeth and Oropharynx as per pre-operative assessment

## 2022-06-05 NOTE — Discharge Instructions (Signed)
Discharge Instructions:  Common Complaints: Pain at the incision site is common.  Some nausea is common and poor appetite after anesthesia is common. The main goal is to stay hydrated the first few days after surgery.  Ice the area for pain control. Bruising is expected.   Diet/ Activity: Diet as tolerated. You may not have an appetite, but it is important to stay hydrated. Drink 64 ounces of water a day. Your appetite will return with time.  Shower per your regular routine daily.  Do not take hot showers. Take warm showers that are less than 10 minutes. Walk everyday for at least 15-20 minutes. Deep cough and move around every 1-2 hours in the first few days after surgery.  Limit excessive movement, lifting > 10 lbs, stretching with the limb if there is an incision on your arm/armpit or leg.   Limit stretching, pulling on your incision if it is located on other parts of your body.  Do not pick at the dermabond glue on your incision sites. This glue film will remain in place for 1-2 weeks and will start to peel off.  Do not pick at the steristrips. These will fall off in 5-7 days. If they peel off, you can remove them.  If the fall off sooner, it is ok. Keep the area clean and dry with a dry bandage.  Do not place lotions or balms on your incision unless instructed to specifically by Dr. Henreitta Leber.   Medication: Take tylenol for pain control.  Tylenol 1000mg  @ 6am, 12noon, 6pm, (Do not exceed 4000mg  of tylenol a day).  Drink plenty of water to also prevent constipation.   Contact Information: If you have questions or concerns, please call our office, [phone], Monday- Thursday 8AM-5PM and Friday 8AM-12Noon.  If it is after hours or on the weekend, please call [facility] Main Number, [phone], [phone] and ask to speak to the surgeon on call for Dr. [phone] at [facility].

## 2022-06-09 ENCOUNTER — Ambulatory Visit: Payer: Self-pay | Admitting: Physician Assistant

## 2022-06-09 ENCOUNTER — Encounter: Payer: Self-pay | Admitting: Internal Medicine

## 2022-06-09 ENCOUNTER — Encounter (HOSPITAL_COMMUNITY): Payer: Self-pay | Admitting: General Surgery

## 2022-06-09 VITALS — BP 106/67 | HR 87 | Temp 97.3°F | Wt 95.5 lb

## 2022-06-09 DIAGNOSIS — G729 Myopathy, unspecified: Secondary | ICD-10-CM

## 2022-06-09 DIAGNOSIS — F17200 Nicotine dependence, unspecified, uncomplicated: Secondary | ICD-10-CM

## 2022-06-09 DIAGNOSIS — R634 Abnormal weight loss: Secondary | ICD-10-CM

## 2022-06-09 NOTE — Progress Notes (Signed)
BP 106/67   Pulse 87   Temp (!) 97.3 F (36.3 C)   Wt 95 lb 8 oz (43.3 kg)   SpO2 97%   BMI 14.96 kg/m    Subjective:    Patient ID: Bradley Mclaughlin, male    DOB: 08-14-1966, 56 y.o.   MRN: [id number]  HPI: Bradley Mclaughlin is a 56 y.o. male presenting on 06/09/2022 for No chief complaint on file.   HPI   Pt is 27yoM who returns for routine follow up.   He is Undergoing eval by neurologist. He Was referred by NeuroSurgery.  He has also completed physical therapy as referred by neurosurgeon.  He had muscle biopsy last Thursday   He has started losing weight.    He says he Eats a bowl of cereal in the morning and a regular supper.   He was Last here in January- weight 103 Sept appt- virtual May appt- virtual April- 2022 new pt-  114 lb (Pt requests virtual appointments frequently due to transportation issues.  This was requested for today's appointment also but in-person was recommended by provider)  Pt says he does Not cocaine- only uses MJ.  He continues to smoke but says he smokes less now than in the past when smoked 2 ppd. Pt never returned FIT test (gave in 02/2021 and again 11/2021)  CK up ++++ PSA 02/2021- nl  He needs to update his cafa/[employer] financial assistance application  Pt says his All time high weight 128 or 129 lb-  that was age he doesn't remember   Most recent CMP- 03/24/2022-  AST & ALT  only mildy elevated, alk phos normal and CBCD unremarkable      Relevant past medical, surgical, family and social history reviewed and updated as indicated. Interim medical history since our last visit reviewed. Allergies and medications reviewed and updated.    Current Outpatient Medications:    Ascorbic Acid (VITAMIN C PO), Take by mouth., Disp: , Rfl:    DULoxetine (CYMBALTA) 60 MG capsule, Take 1 capsule (60 mg total) by mouth daily., Disp: 30 capsule, Rfl: 11   loratadine (CLARITIN) 10 MG tablet, Take 10 mg by mouth daily., Disp: , Rfl:    meloxicam  (MOBIC) 15 MG tablet, Take 1 tablet (15 mg total) by mouth daily., Disp: 30 tablet, Rfl: 6   Multiple Vitamins-Minerals (CENTRUM SILVER PO), Take by mouth., Disp: , Rfl:    traZODone (DESYREL) 100 MG tablet, Take 100 mg by mouth at bedtime., Disp: , Rfl:    methocarbamol (ROBAXIN) 500 MG tablet, Take 1 tablet (500 mg total) by mouth 3 (three) times daily. (Patient not taking: Reported on 06/09/2022), Disp: 60 tablet, Rfl: 1   Review of Systems  Per HPI unless specifically indicated above     Objective:    BP 106/67   Pulse 87   Temp (!) 97.3 F (36.3 C)   Wt 95 lb 8 oz (43.3 kg)   SpO2 97%   BMI 14.96 kg/m   Wt Readings from Last 3 Encounters:  06/09/22 95 lb 8 oz (43.3 kg)  06/02/22 92 lb (41.7 kg)  05/29/22 92 lb (41.7 kg)    Physical Exam Vitals reviewed.  Constitutional:      General: He is not in acute distress.    Appearance: He is cachectic. He is not toxic-appearing.  HENT:     Head: Normocephalic and atraumatic.  Cardiovascular:     Rate and Rhythm: Normal rate and regular rhythm.  Pulmonary:     Effort: Pulmonary effort is normal.     Breath sounds: Normal breath sounds. No wheezing.  Abdominal:     General: Bowel sounds are normal.     Palpations: Abdomen is soft.     Tenderness: There is no abdominal tenderness.  Musculoskeletal:     Cervical back: Neck supple.     Right lower leg: No edema.     Left lower leg: No edema.     Comments: Left deltoid muscle with bruising at site of biopsy.  Wound is healing.  No redness or drainage or signs infection.    Lymphadenopathy:     Cervical: No cervical adenopathy.  Skin:    General: Skin is warm and dry.  Neurological:     Mental Status: He is alert and oriented to person, place, and time.     Gait: Gait abnormal.  Psychiatric:        Behavior: Behavior normal.           Assessment & Plan:    Encounter Diagnoses  Name Primary?   Weight loss Yes   Myopathy    Tobacco use disorder       myopathy -pt to continue with neurology for evaluation of myopathy   Weight loss- -refer for CT chest and abd -Referral for colonscopy  -weight loss may be due to cause of myopathy.  Or it may not be. -labs done May reviewed.  No repeat at this time -pt encouraged to stop smoking as it can hamper appetite and is just bad for his health in general -encouraged pt to eat regularly.  He was given samples ensure pudding   -pt encouraged to Update cafa  -pt to F/u 6 weeks.   He is encouraged to contact office sooner prn

## 2022-06-12 ENCOUNTER — Telehealth: Payer: Self-pay | Admitting: Physician Assistant

## 2022-06-12 NOTE — Telephone Encounter (Signed)
Pt was called and notified CT appointment on Monday 07/07/22.  He is to arrive to Radiology dept at 7:30am.   He needs to pick up contrast prior to the day of the test.  He is notified NPO 4 hours prior to CT.  On 07/07/22, At 6am he is to drink the first bottle of contrast and at 7am he is to drink the second bottle of contrast.  He will then need arrive to radiology department at 7:30am.  Pt repeated information back to me and states understanding.  He is aware he needs to arrange his transportation ahead of time so as not to miss his appointment.

## 2022-06-16 ENCOUNTER — Encounter (HOSPITAL_COMMUNITY): Payer: Self-pay

## 2022-06-16 ENCOUNTER — Ambulatory Visit (INDEPENDENT_AMBULATORY_CARE_PROVIDER_SITE_OTHER): Payer: Self-pay | Admitting: General Surgery

## 2022-06-16 DIAGNOSIS — M6281 Muscle weakness (generalized): Secondary | ICD-10-CM

## 2022-06-16 NOTE — Progress Notes (Signed)
[facility]  I am calling the patient for post operative evaluation. This is not a billable encounter as it is under the global charges for the surgery.  The patient had a muscle biopsy on the left deltoid on 06/06/22. The patient reports that he is doing well. The incisions are healing well. The patient has no concerns. He is bruised a little.  Pathology: Pending- calling to see when that will be resulted. Pathology will get back to [place].   Will see the patient PRN.   Algis Greenhouse, MD [facility] [address] [place], [place] [address] [phone] (office)

## 2022-06-17 LAB — SURGICAL PATHOLOGY

## 2022-06-18 NOTE — Progress Notes (Signed)
Ok under media tab are the pathology results; "Chronic Myopathy with features of necrosis which the etiology is not clear, and focal. Clinical correlation suggested." Can you all let the patient know, and make sure Dr. Terrace Arabia get these results.

## 2022-06-23 ENCOUNTER — Encounter (HOSPITAL_COMMUNITY): Payer: Self-pay

## 2022-06-23 ENCOUNTER — Telehealth: Payer: Self-pay

## 2022-06-23 NOTE — Telephone Encounter (Signed)
Received Muscle Biopsy results.  I have placed in MD's office for review once she is back in the office.

## 2022-06-26 NOTE — Progress Notes (Signed)
Please call and advise patient that the muscle biopsy was abnormal, it showed evidence of muscle damage but the cause of the muscle damage was not clear. Dr. Terrace Arabia will follow up with you with further instructions.   Dr. Teresa Coombs

## 2022-06-30 ENCOUNTER — Telehealth: Payer: Self-pay | Admitting: Neurology

## 2022-06-30 ENCOUNTER — Other Ambulatory Visit: Payer: Self-pay

## 2022-06-30 DIAGNOSIS — R269 Unspecified abnormalities of gait and mobility: Secondary | ICD-10-CM

## 2022-06-30 DIAGNOSIS — R29898 Other symptoms and signs involving the musculoskeletal system: Secondary | ICD-10-CM

## 2022-06-30 DIAGNOSIS — R748 Abnormal levels of other serum enzymes: Secondary | ICD-10-CM

## 2022-06-30 MED ORDER — PREDNISONE 20 MG PO TABS: 20.0000 mg | ORAL_TABLET | Freq: Every day | ORAL | 3 refills | Status: DC

## 2022-06-30 MED ORDER — PREDNISONE 20 MG PO TABS: ORAL_TABLET | ORAL | 3 refills | Status: DC

## 2022-06-30 NOTE — Progress Notes (Signed)
Pharmacy is requesting a new rx with written dosage instructions. Rx sent to pharmacy.

## 2022-06-30 NOTE — Addendum Note (Signed)
Addended by: Christophe Louis E on: 06/30/2022 03:40 PM   Modules accepted: Orders

## 2022-06-30 NOTE — Telephone Encounter (Signed)
I spoke with the patient and informed him of the results. He verbalized understanding of the findings and expressed appreciation for the call. All questions answered.  He is requesting prednisone to be sent to [facility]. I will resend the medication there. His appointment has been rescheduled for September.

## 2022-06-30 NOTE — Telephone Encounter (Signed)
Left message for a return call

## 2022-06-30 NOTE — Telephone Encounter (Signed)
Please call patient  Reviewed muscle biopsy report, evidence of focal necrotic changes, chronic myopathy, no clear etiology identified  Patient have significant elevated CPK 7348, also progressive weakness  We will start the prednisone tapering  Prednisone 20 mg 3 tablets every morning after breakfast xone month  If he has stomach irritations, may take over-the-counter omeprazole 20 mg daily   Prednisone 20 mg 2 and half tablets every morning x one month,  Prednisone 20 mg 2 tablets every morning for 1 month  Move up his follow-up appointment with me in September     Meds ordered this encounter  Medications   predniSONE (DELTASONE) 20 MG tablet    Sig: Take 1 tablet (20 mg total) by mouth daily. Tapering dose according to instruction    Dispense:  90 tablet    Refill:  3

## 2022-07-07 ENCOUNTER — Ambulatory Visit (HOSPITAL_COMMUNITY)
Admission: RE | Admit: 2022-07-07 | Discharge: 2022-07-07 | Disposition: A | Discharge status: Home / Self Care | Payer: Self-pay | Source: Ambulatory Visit | Attending: Physician Assistant | Admitting: Physician Assistant

## 2022-07-07 DIAGNOSIS — R634 Abnormal weight loss: Secondary | ICD-10-CM | POA: Insufficient documentation

## 2022-07-07 MED ORDER — IOHEXOL 300 MG/ML  SOLN
80.0000 mL | Freq: Once | INTRAMUSCULAR | Status: AC | PRN
  Administered 2022-07-07: 80 mL via INTRAVENOUS

## 2022-07-14 ENCOUNTER — Ambulatory Visit: Payer: Self-pay | Admitting: Gastroenterology

## 2022-07-22 ENCOUNTER — Ambulatory Visit: Payer: Self-pay | Admitting: Physician Assistant

## 2022-07-23 ENCOUNTER — Ambulatory Visit (INDEPENDENT_AMBULATORY_CARE_PROVIDER_SITE_OTHER): Payer: Medicaid Other | Admitting: Neurology

## 2022-07-23 ENCOUNTER — Encounter: Payer: Self-pay | Admitting: Neurology

## 2022-07-23 VITALS — BP 97/60 | HR 68 | Ht 67.0 in | Wt 98.6 lb

## 2022-07-23 DIAGNOSIS — G7249 Other inflammatory and immune myopathies, not elsewhere classified: Secondary | ICD-10-CM | POA: Diagnosis not present

## 2022-07-23 DIAGNOSIS — R269 Unspecified abnormalities of gait and mobility: Secondary | ICD-10-CM | POA: Diagnosis not present

## 2022-07-23 DIAGNOSIS — R748 Abnormal levels of other serum enzymes: Secondary | ICD-10-CM | POA: Diagnosis not present

## 2022-07-23 DIAGNOSIS — E559 Vitamin D deficiency, unspecified: Secondary | ICD-10-CM | POA: Diagnosis not present

## 2022-07-23 DIAGNOSIS — R7309 Other abnormal glucose: Secondary | ICD-10-CM | POA: Diagnosis not present

## 2022-07-23 DIAGNOSIS — Z13228 Encounter for screening for other metabolic disorders: Secondary | ICD-10-CM | POA: Diagnosis not present
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
## 2022-07-24 ENCOUNTER — Encounter: Payer: Self-pay | Admitting: Physician Assistant

## 2022-07-24 ENCOUNTER — Ambulatory Visit: Payer: Self-pay | Admitting: Physician Assistant

## 2022-07-24 VITALS — BP 110/70 | HR 63 | Temp 98.3°F | Ht 67.0 in | Wt 97.0 lb

## 2022-07-24 DIAGNOSIS — R9389 Abnormal findings on diagnostic imaging of other specified body structures: Secondary | ICD-10-CM

## 2022-07-24 DIAGNOSIS — E278 Other specified disorders of adrenal gland: Secondary | ICD-10-CM

## 2022-07-24 DIAGNOSIS — R634 Abnormal weight loss: Secondary | ICD-10-CM

## 2022-07-24 DIAGNOSIS — G7249 Other inflammatory and immune myopathies, not elsewhere classified: Secondary | ICD-10-CM

## 2022-07-24 LAB — COMPREHENSIVE METABOLIC PANEL
ALT: 59 IU/L — ABNORMAL HIGH (ref 0–44)
AST: 34 IU/L (ref 0–40)
Albumin/Globulin Ratio: 1.6 (ref 1.2–2.2)
Albumin: 4.4 g/dL (ref 3.8–4.9)
Alkaline Phosphatase: 68 IU/L (ref 44–121)
BUN/Creatinine Ratio: 31 — ABNORMAL HIGH (ref 9–20)
BUN: 12 mg/dL (ref 6–24)
Bilirubin Total: 0.3 mg/dL (ref 0.0–1.2)
CO2: 30 mmol/L — ABNORMAL HIGH (ref 20–29)
Calcium: 9.5 mg/dL (ref 8.7–10.2)
Chloride: 96 mmol/L (ref 96–106)
Creatinine, Ser: 0.39 mg/dL — ABNORMAL LOW (ref 0.76–1.27)
Globulin, Total: 2.8 g/dL (ref 1.5–4.5)
Glucose: 98 mg/dL (ref 70–99)
Potassium: 4.6 mmol/L (ref 3.5–5.2)
Sodium: 140 mmol/L (ref 134–144)
Total Protein: 7.2 g/dL (ref 6.0–8.5)
eGFR: 131 mL/min/{1.73_m2} (ref >59)

## 2022-07-24 LAB — HEMOGLOBIN A1C
Est. average glucose Bld gHb Est-mCnc: 105 mg/dL
Hgb A1c MFr Bld: 5.3 % (ref 4.8–5.6)

## 2022-07-24 LAB — CK: Total CK: 649 U/L (ref 41–331)

## 2022-07-24 LAB — VITAMIN D 25 HYDROXY (VIT D DEFICIENCY, FRACTURES): Vit D, 25-Hydroxy: 18 ng/mL — ABNORMAL LOW (ref 30.0–100.0)

## 2022-07-24 NOTE — Progress Notes (Unsigned)
BP 110/70   Pulse 63   Temp 98.3 F (36.8 C)   Ht 5\' 7"  (1.702 m)   Wt 97 lb (44 kg)   SpO2 97%   BMI 15.19 kg/m    Subjective:    Patient ID: Bradley Mclaughlin, male    DOB: [birth year], 56 y.o.   MRN: [id number]  HPI: Bradley Mclaughlin is a 56 y.o. male presenting on 07/24/2022 for Weight Loss   HPI  Chief Complaint  Patient presents with   Weight Loss    Pt was seen by neurology yesterday and they discussed results of his muscle biopsy which revealed inflammatory myositis.  He was started on prednisone.   Pt missed his appointment with GI in august (he was referred for evaluation of unintended weight loss).  He was rescheduled to be seen next week.    Pt is ambulating with his cane.  He says he is getting around pretty well with that but he did have a fall last weekend without injury.        Relevant past medical, surgical, family and social history reviewed and updated as indicated. Interim medical history since our last visit reviewed. Allergies and medications reviewed and updated.   Current Outpatient Medications:    Ascorbic Acid (VITAMIN C PO), Take by mouth., Disp: , Rfl:    DULoxetine (CYMBALTA) 60 MG capsule, Take 1 capsule (60 mg total) by mouth daily., Disp: 30 capsule, Rfl: 11   loratadine (CLARITIN) 10 MG tablet, Take 10 mg by mouth daily., Disp: , Rfl:    meloxicam (MOBIC) 15 MG tablet, Take 1 tablet (15 mg total) by mouth daily., Disp: 30 tablet, Rfl: 6   Multiple Vitamins-Minerals (CENTRUM SILVER PO), Take by mouth., Disp: , Rfl:    predniSONE (DELTASONE) 20 MG tablet, Take prednisone 20 mg 3 tablets every morning after breakfast for one month, then prednisone 20 mg 2 and half tablets every morning for one month, then prednisone 20 mg 2 tablets every morning for one month., Disp: 90 tablet, Rfl: 3   traZODone (DESYREL) 100 MG tablet, Take 100 mg by mouth at bedtime., Disp: , Rfl:    Review of Systems  Per HPI unless specifically indicated above      Objective:    BP 110/70   Pulse 63   Temp 98.3 F (36.8 C)   Ht 5\' 7"  (1.702 m)   Wt 97 lb (44 kg)   SpO2 97%   BMI 15.19 kg/m   Wt Readings from Last 3 Encounters:  07/24/22 97 lb (44 kg)  07/23/22 98 lb 9.6 oz (44.7 kg)  06/09/22 95 lb 8 oz (43.3 kg)    Physical Exam Constitutional:      General: He is not in acute distress.    Appearance: He is cachectic.  HENT:     Head: Normocephalic and atraumatic.  Cardiovascular:     Rate and Rhythm: Normal rate and regular rhythm.  Pulmonary:     Effort: Pulmonary effort is normal. No respiratory distress.     Breath sounds: Normal breath sounds. No wheezing.  Abdominal:     General: Bowel sounds are normal.     Tenderness: There is no abdominal tenderness.  Musculoskeletal:     Right lower leg: No edema.     Left lower leg: No edema.  Neurological:     Mental Status: He is alert and oriented to person, place, and time.  Psychiatric:        Mood  and Affect: Mood normal.        Speech: Speech normal.        Behavior: Behavior normal. Behavior is cooperative.            Assessment & Plan:   Encounter Diagnoses  Name Primary?   Weight loss Yes   Inflammatory myopathy    Adrenal mass (HCC)    Abnormal CT scan      -will get MRI to further evaluate adrenal mass -pt encouraged to Increase caloric intake -discussed with pt that his inflammatory myositis is likely cause of his weight loss -pt to follow up  4-6 wk.  He is to contact office sooner prn

## 2022-07-28 ENCOUNTER — Telehealth: Payer: Self-pay

## 2022-07-28 NOTE — Telephone Encounter (Signed)
Client contacted [facility] in need of transportation for 07/30/22 at 9:15 am to [facility] at [employer]. Transportation arranged with [employer].  Client notified [employer] states pick up times at 0845 am.   Francee Nodal RN Clara Gunn/[facility]

## 2022-07-30 ENCOUNTER — Ambulatory Visit (INDEPENDENT_AMBULATORY_CARE_PROVIDER_SITE_OTHER): Payer: Self-pay | Admitting: Gastroenterology

## 2022-07-30 ENCOUNTER — Encounter: Payer: Self-pay | Admitting: Gastroenterology

## 2022-07-30 DIAGNOSIS — E559 Vitamin D deficiency, unspecified: Secondary | ICD-10-CM

## 2022-07-30 DIAGNOSIS — Z1211 Encounter for screening for malignant neoplasm of colon: Secondary | ICD-10-CM

## 2022-07-30 DIAGNOSIS — R634 Abnormal weight loss: Secondary | ICD-10-CM | POA: Insufficient documentation

## 2022-07-30 MED ORDER — VITAMIN D (ERGOCALCIFEROL) 1.25 MG (50000 UNIT) PO CAPS: 50000.0000 [IU] | ORAL_CAPSULE | ORAL | 0 refills | Status: DC

## 2022-07-30 NOTE — Progress Notes (Signed)
GI Office Note    Referring Provider: Jacquelin Hawking, PA-C Primary Care Physician:  Jacquelin Hawking, PA-C  Primary Gastroenterologist: Hennie Duos. Marletta Lor, DO   Chief Complaint   Chief Complaint  Patient presents with   Weight Loss     History of Present Illness   Bradley Mclaughlin is a 56 y.o. male presenting today at the request of Jacquelin Hawking for further evaluation of weight loss, consider colonoscopy.  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  He is followed by neurology, Dr. Terrace Arabia.  During that time he has had notable weight loss.  He weighed 114 pounds in April 2022, down to 95 pounds today.  His weight loss seems to have subsided.  He has a history of cocaine use but denies any in the past few years.  He consumes alcohol heavily on the weekends at a friend's house.  Splits a case of beer in a day.  States his finances do not allow him to buy alcohol himself.  Occasionally will have a 40 ounce beer during the week.  He denies any abdominal pain.  Appetite good.  No heartburn or indigestion.  No dysphagia.  Bowel movements regular.  No melena or rectal bleeding.  Recently diagnosed with vitamin D deficiency.  States he was advised to purchase over-the-counter vitamin D but was unable to afford it.  He is requesting prescription vitamin D which would be free for him.  Labs from September 2023: Creatinine 0.39, ALT 59.  Vitamin D 18.0, A1c 5.3, total CK 649 down from 3348  LFTs chronically elevated.  In April 2022 his AST was 148, ALT 96.  May 2023 his AST was 66, ALT 69.  From June 2023: ANA negative  CT chest abdomen pelvis August 2023: IMPRESSION: 1. 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 Given patient history, consider further evaluation with adrenal  protocol MRI. If MRI is not performed, then recommend 1 year follow-up adrenal washout CT. 2. Mild streaky posterolateral RIGHT LOWER lobe basilar opacity, new since 2010, question atelectasis/scarring versus small area of infection. 3. No other significant abnormalities.      Medications   Current Outpatient Medications  Medication Sig Dispense Refill   Ascorbic Acid (VITAMIN C PO) Take by mouth.     DULoxetine (CYMBALTA) 60 MG capsule Take 1 capsule (60 mg total) by mouth daily. 30 capsule 11   loratadine (CLARITIN) 10 MG tablet Take 10 mg by mouth daily.     meloxicam (MOBIC) 15 MG tablet Take 1 tablet (15 mg total) by mouth daily. 30 tablet 6   Multiple Vitamins-Minerals (CENTRUM SILVER PO) Take by mouth.     predniSONE (DELTASONE) 20 MG tablet Take prednisone 20 mg 3 tablets every morning after breakfast for one month, then prednisone 20 mg 2 and half tablets every morning for one month, then prednisone 20 mg 2 tablets every morning for one month. 90 tablet 3   traZODone (DESYREL) 100 MG tablet Take 100 mg by mouth at bedtime.     No current facility-administered medications for this visit.    Allergies   Allergies as of 07/30/2022   (No Known Allergies)    Past Medical History   Past Medical History:  Diagnosis Date   Bipolar depression (HCC)    Cirrhosis of  liver (HCC)    no evidence of cirrhosis on imaging   Myositis     Past Surgical History   Past Surgical History:  Procedure Laterality Date   FRACTURE SURGERY     femur   HAND SURGERY Right    MUSCLE BIOPSY Left 06/05/2022   Procedure: MUSCLE BIOPSY, DELTOID, DEEP;  Surgeon: Lucretia Roers, MD;  Location: [facility];  Service: General;  Laterality: Left;    Past Family History   Family History  Problem Relation Age of Onset   Cancer Mother    Colon cancer Neg Hx     Past Social History   Social History   Socioeconomic History   Marital status: Single    Spouse name: Not on file   Number of  children: Not on file   Years of education: Not on file   Highest education level: Not on file  Occupational History   Not on file  Tobacco Use   Smoking status: Every Day    Packs/day: 1.00    Types: Cigarettes   Smokeless tobacco: Never   Tobacco comments:    previously smoking 2 ppd  Vaping Use   Vaping Use: Never used  Substance and Sexual Activity   Alcohol use: Not Currently    Comment: usually 1/2 - 1 case a day on the weekends   Drug use: Yes    Types: Marijuana   Sexual activity: Yes  Other Topics Concern   Not on file  Social History Narrative   Not on file   Social Determinants of Health   Financial Resource Strain: Not on file  Food Insecurity: Not on file  Transportation Needs: Unmet Transportation Needs (07/28/2022)   PRAPARE - Administrator, Civil Service (Medical): Yes    Lack of Transportation (Non-Medical): Yes  Physical Activity: Not on file  Stress: Not on file  Social Connections: Not on file  Intimate Partner Violence: Not on file    Review of Systems   General: Negative for anorexia, weight loss, fever, chills, fatigue, positive weakness.  See HPI Eyes: Negative for vision changes.  ENT: Negative for hoarseness, difficulty swallowing , nasal congestion. CV: Negative for chest pain, angina, palpitations, dyspnea on exertion, peripheral edema.  Respiratory: Negative for dyspnea at rest, dyspnea on exertion, cough, sputum, wheezing.  GI: See history of present illness. GU:  Negative for dysuria, hematuria, urinary incontinence, urinary frequency, nocturnal urination.  MS: Positive for joint pain, low back pain.  Derm: Negative for rash or itching.  Neuro: Negative for  abnormal sensation, seizure, frequent headaches, memory loss,  confusion.  See HPI Psych: Negative for anxiety, depression, suicidal ideation, hallucinations.  Endo: See HPI Heme: Negative for bruising or bleeding. Allergy: Negative for rash or hives.  Physical  Exam   BP 107/70 (BP Location: Right Arm, Patient Position: Sitting, Cuff Size: Normal)   Pulse (!) 59   Temp 97.7 F (36.5 C) (Temporal)   Ht 5\' 7"  (1.702 m)   Wt 95 lb 9.6 oz (43.4 kg)   BMI 14.97 kg/m    General: Thin, well-developed in no acute distress.  Head: Normocephalic, atraumatic.   Eyes: Conjunctiva pink, no icterus. Mouth: Oropharyngeal mucosa moist and pink , no lesions erythema or exudate.  Poor dentition. Neck: Supple without thyromegaly, masses, or lymphadenopathy.  Lungs: Clear to auscultation bilaterally.  Heart: Regular rate and rhythm, no murmurs rubs or gallops.  Abdomen: Bowel sounds are normal, nontender, nondistended, no hepatosplenomegaly or masses,  no  abdominal bruits or hernia, no rebound or guarding.   Rectal: not performed Extremities: No lower extremity edema. No clubbing or deformities.  Neuro: Alert and oriented x 4 , grossly normal neurologically.  Skin: Warm and dry, no rash or jaundice.   Psych: Alert and cooperative, normal mood and affect.  Labs   Lab Results  Component Value Date   CREATININE 0.39 (L) 07/23/2022   BUN 12 07/23/2022   NA 140 07/23/2022   K 4.6 07/23/2022   CL 96 07/23/2022   CO2 30 (H) 07/23/2022   Lab Results  Component Value Date   HGBA1C 5.3 07/23/2022   Lab Results  Component Value Date   WBC 8.7 03/24/2022   HGB 16.0 03/24/2022   HCT 45.4 03/24/2022   MCV 97 03/24/2022   PLT 307 03/24/2022   .lsatlft Lab Results  Component Value Date   TSH 1.620 03/24/2022   Lab Results  Component Value Date   INR 1.1 06/02/2022   INR 1.0 03/03/2009    Imaging Studies   CT CHEST ABDOMEN PELVIS W CONTRAST  Result Date: 07/08/2022 CLINICAL DATA:  56 year old male with unintended and unexplained weight loss. EXAM: CT CHEST, ABDOMEN, AND PELVIS WITH CONTRAST TECHNIQUE: Multidetector CT imaging of the chest, abdomen and pelvis was performed following the standard protocol during bolus administration of intravenous  contrast. RADIATION DOSE REDUCTION: This exam was performed according to the departmental dose-optimization program which includes automated exposure control, adjustment of the mA and/or kV according to patient size and/or use of iterative reconstruction technique. CONTRAST:  35mL OMNIPAQUE IOHEXOL 300 MG/ML  SOLN COMPARISON:  03/03/2009 CTs FINDINGS: CT CHEST FINDINGS Cardiovascular: No significant vascular findings. Normal heart size. No pericardial effusion. Mediastinum/Nodes: Small calcified RIGHT hilar lymph nodes are compatible with prior granulomatous disease. No enlarged lymph nodes or mediastinal mass noted. No significant thyroid, tracheal or esophageal abnormality identified. Lungs/Pleura: Mild streaky posterolateral RIGHT LOWER lobe basilar opacity is new since 2010, question atelectasis/scarring versus small area of infection. There is no evidence of airspace disease, consolidation, mass, suspicious nodule, pleural effusion or pneumothorax. Musculoskeletal: No acute or suspicious bony abnormalities are noted. CT ABDOMEN PELVIS FINDINGS Hepatobiliary: The liver and gallbladder are unremarkable. There is no evidence of intrahepatic or extrahepatic biliary dilatation. Pancreas: Unremarkable Spleen: Unremarkable Adrenals/Urinary Tract: A new 1.2 cm LEFT adrenal mass is noted (series 2: Image 63) measuring 46 Hounsfield units on portal venous phase images and 45 Hounsfield units on delayed images. Kidneys, RIGHT adrenal gland and bladder are unremarkable. Stomach/Bowel: Stomach is within normal limits. Appendix appears normal. No evidence of bowel wall thickening, distention, or inflammatory changes. Vascular/Lymphatic: No significant vascular findings are present. No enlarged abdominal or pelvic lymph nodes. Reproductive: Mild prostate enlargement again noted. Other: No ascites, focal collection or pneumoperitoneum. Musculoskeletal: No acute or suspicious bony abnormalities are noted. IMPRESSION: 1. New 1.2  cm LEFT adrenal mass, nonspecific but more likely to represent an adenoma. Given patient history, consider further evaluation with adrenal protocol MRI. If MRI is not performed, then recommend 1 year follow-up adrenal washout CT. 2. Mild streaky posterolateral RIGHT LOWER lobe basilar opacity, new since 2010, question atelectasis/scarring versus small area of infection. 3. No other significant abnormalities. Electronically Signed   By: Harmon Pier M.D.   On: 07/08/2022 09:10    Assessment   Colon cancer screening: No prior colonoscopy.  No GI symptoms.  Abnormal weight loss: Likely multifactorial, suspect some related to myositis, muscle atrophy.  He describes significant decline in his physical  capabilities over the past several years.  Recent imaging with adrenal lesion which is being followed by MRI per PCP.  Vitamin D deficiency: Patient reports he cannot afford over-the-counter regimen.  Abnormal LFTs: possibly secondary to etoh consumption vs prednisone??? Need to screen for viral hepatitis, autoimmune hepatitis.    PLAN   Vitamin D 50,000 units once weekly for 8 weeks.  Prescription sent to [facility].  After he completes 8 weeks of treatment, he should continue vitamin D 2000 units daily. Colonoscopy in the near future.  ASA 3.  I have discussed the risks, alternatives, benefits with regards to but not limited to the risk of reaction to medication, bleeding, infection, perforation and the patient is agreeable to proceed. Written consent to be obtained. You should consider cutting back on alcohol use, goal of no more than 3 drinks in a 24-hour period of time.   Leanna Battles. Melvyn Neth, MHS, PA-C [facility]

## 2022-07-30 NOTE — Patient Instructions (Signed)
Good news! I was able to send in your Vitamin D through [facility]. You will take one capsule every 7 days for 8 weeks. After you finish the prescription, you will need to purchase over the counter vitamin D and continue to take 2000 units DAILY. Colonoscopy to be scheduled. See separate instructions. You should cut back on alcohol use. Drinking more than three 12 ounces beers in 24 hours is more than recommended amount for a male.

## 2022-07-31 ENCOUNTER — Encounter: Payer: Self-pay | Admitting: Gastroenterology

## 2022-07-31 ENCOUNTER — Telehealth: Payer: Self-pay | Admitting: *Deleted

## 2022-07-31 ENCOUNTER — Telehealth: Payer: Self-pay | Admitting: Gastroenterology

## 2022-07-31 NOTE — Telephone Encounter (Signed)
Lmom for pt to return my call.  

## 2022-07-31 NOTE — Telephone Encounter (Signed)
Lmovm to call back to schedule TCS with Dr. Marletta Lor ASA 3

## 2022-07-31 NOTE — Telephone Encounter (Signed)
Please let patient know that I forgot to address his elevated liver labs when he was here yesterday.  Liver labs were significantly elevated in 2023, improved more recently but still abnormal.  Alcohol likely playing a role but we need to also screen for hepatitis B and hepatitis C as well as autoimmune process.  Please arrange for:  Hep B surface antigen Hep C antibody ANA ASMA AMA IgG/IgA/IgM Iron/tibc/ferritin

## 2022-08-04 ENCOUNTER — Other Ambulatory Visit: Payer: Self-pay

## 2022-08-04 DIAGNOSIS — R7989 Other specified abnormal findings of blood chemistry: Secondary | ICD-10-CM

## 2022-08-04 MED ORDER — PEG 3350-KCL-NA BICARB-NACL 420 G PO SOLR: 4000.0000 mL | Freq: Once | ORAL | 0 refills | Status: AC

## 2022-08-04 NOTE — Telephone Encounter (Signed)
Pt was made aware and verbalized understanding. Labs were ordered. Instructed patient to call when he was on his way to the lab so that the orders can be released due to him having them done at [facility].

## 2022-08-04 NOTE — Telephone Encounter (Signed)
Pt returned call. He has been scheduled for 10/16 at 12:15pm. Aware will mail instructions/pre-op appt. Rx sent to [facility] [place].

## 2022-08-05 ENCOUNTER — Encounter: Payer: Self-pay | Admitting: *Deleted

## 2022-08-07 ENCOUNTER — Other Ambulatory Visit (HOSPITAL_COMMUNITY): Payer: Self-pay

## 2022-08-07 ENCOUNTER — Telehealth: Payer: Self-pay

## 2022-08-07 NOTE — Telephone Encounter (Signed)
Client notified [facility] that he has a colonoscopy prep at [facility] he cannot afford. Discussed I will check on prices for him and work on that and let him know. He is agreeable. Price at [place] 39.63$  [facility] Gunn/[facility].

## 2022-08-11 ENCOUNTER — Ambulatory Visit (HOSPITAL_COMMUNITY): Payer: Medicaid Other

## 2022-08-22 ENCOUNTER — Telehealth: Payer: Self-pay | Admitting: *Deleted

## 2022-08-22 NOTE — Telephone Encounter (Signed)
LMOVM to call back 

## 2022-08-22 NOTE — Telephone Encounter (Signed)
-----   Message from Josue Hector sent at 08/22/2022 11:34 AM EDT ----- This patient LM that he has a death in the family and needs to reschedule.  I have pulled him into [place] dep[place].  Thanks,

## 2022-08-27 ENCOUNTER — Encounter (HOSPITAL_COMMUNITY): Payer: Medicaid Other

## 2022-09-01 ENCOUNTER — Ambulatory Visit (HOSPITAL_COMMUNITY): Admission: RE | Admit: 2022-09-01 | Payer: Medicaid Other | Source: Home / Self Care

## 2022-09-01 ENCOUNTER — Encounter (HOSPITAL_COMMUNITY): Admission: RE | Payer: Self-pay | Source: Home / Self Care

## 2022-09-01 SURGERY — COLONOSCOPY WITH PROPOFOL
Anesthesia: Monitor Anesthesia Care

## 2022-09-02 NOTE — Telephone Encounter (Signed)
Tried to call patient but call would not go through. Call could not be completed at this time. Mychart message sent.

## 2022-09-03 ENCOUNTER — Ambulatory Visit: Payer: Self-pay | Admitting: Physician Assistant

## 2022-09-25 ENCOUNTER — Ambulatory Visit: Payer: Self-pay | Admitting: Adult Health

## 2022-09-26 ENCOUNTER — Telehealth: Payer: Self-pay

## 2022-09-26 NOTE — Telephone Encounter (Signed)
Attempted call to follow up regarding transportation and PCP now that client has Medicaid. No answer, left voicemail.  Francee Nodal RN [employer]

## 2022-09-30 ENCOUNTER — Telehealth: Payer: Self-pay

## 2022-09-30 DIAGNOSIS — G7249 Other inflammatory and immune myopathies, not elsewhere classified: Secondary | ICD-10-CM

## 2022-09-30 MED ORDER — PREDNISONE 10 MG PO TABS: 30.0000 mg | ORAL_TABLET | Freq: Every day | ORAL | 6 refills | Status: DC

## 2022-09-30 NOTE — Telephone Encounter (Signed)
Received a call from pharmacist Sue Lush and we discussed pt's prednisone RX.  Pharmacy needs to confirm the dosage of the Prednisone since the pt has completed the 3 month taper.   I reviewed office note from 07/23/2022 plan states:             Confirmed by muscle biopsy of left deltoid on June 05, 2022, chronic myopathy with features of necrosis,             Multiple repeat CPK level remains elevated 3348,             He was started on prednisone 60 mg July 06, 2022, 10 mg decrement every month             Is overall tolerating it well,                   Cymbalta 60 mg daily also helped his muscle achy pain, suggesting only use Mobic as needed,             Repeat CPK             Return to clinic in 3 to 4 months   Pt f/u scheduled for next month but updated rx for prednisone is needed.   Will have MD review.

## 2022-09-30 NOTE — Addendum Note (Signed)
Addended by: Levert Feinstein on: 09/30/2022 03:23 PM   Modules accepted: Orders

## 2022-09-30 NOTE — Telephone Encounter (Signed)
Meds ordered this encounter  Medications   predniSONE (DELTASONE) 10 MG tablet    Sig: Take 3 tablets (30 mg total) by mouth daily with breakfast.    Dispense:  90 tablet    Refill:  6  Please double check current dose of prednisone, from the record he should be on 30  mg daily,

## 2022-10-02 NOTE — Telephone Encounter (Signed)
I called pt and updated the pt on Dr. Zannie Cove recommendation. He verbalized understanding and appreciation for the call. Prednisone currently  40 mg per day will change to 30 mg.  Will keep f/u as scheduled for December.

## 2022-10-28 ENCOUNTER — Ambulatory Visit: Payer: Medicaid Other | Admitting: Neurology

## 2022-11-04 ENCOUNTER — Other Ambulatory Visit: Payer: Self-pay

## 2022-12-30 ENCOUNTER — Other Ambulatory Visit: Payer: Self-pay | Admitting: Neurology

## 2023-01-05 ENCOUNTER — Ambulatory Visit: Payer: Medicaid Other | Admitting: Neurology

## 2023-01-15 ENCOUNTER — Encounter: Payer: Self-pay | Admitting: Radiology

## 2023-03-16 ENCOUNTER — Ambulatory Visit: Payer: Medicaid Other | Admitting: Neurology

## 2023-03-16 ENCOUNTER — Encounter: Payer: Self-pay | Admitting: Neurology

## 2023-03-16 ENCOUNTER — Other Ambulatory Visit: Payer: Self-pay | Admitting: Neurology

## 2023-03-16 VITALS — BP 146/78 | HR 72 | Ht 67.0 in | Wt 109.5 lb

## 2023-03-16 DIAGNOSIS — R29898 Other symptoms and signs involving the musculoskeletal system: Secondary | ICD-10-CM

## 2023-03-16 DIAGNOSIS — R799 Abnormal finding of blood chemistry, unspecified: Secondary | ICD-10-CM | POA: Diagnosis not present

## 2023-03-16 DIAGNOSIS — G7249 Other inflammatory and immune myopathies, not elsewhere classified: Secondary | ICD-10-CM | POA: Diagnosis not present

## 2023-03-16 DIAGNOSIS — R748 Abnormal levels of other serum enzymes: Secondary | ICD-10-CM

## 2023-03-16 DIAGNOSIS — R269 Unspecified abnormalities of gait and mobility: Secondary | ICD-10-CM

## 2023-03-16 DIAGNOSIS — R7989 Other specified abnormal findings of blood chemistry: Secondary | ICD-10-CM | POA: Diagnosis not present

## 2023-03-16 DIAGNOSIS — R7309 Other abnormal glucose: Secondary | ICD-10-CM | POA: Diagnosis not present

## 2023-03-16 MED ORDER — FOLIC ACID 1 MG PO TABS: 1.0000 mg | ORAL_TABLET | Freq: Every day | ORAL | 3 refills | Status: DC

## 2023-03-16 MED ORDER — METHOTREXATE SODIUM 2.5 MG PO TABS: 15.0000 mg | ORAL_TABLET | ORAL | 11 refills | Status: DC

## 2023-03-16 MED ORDER — PREDNISONE 10 MG PO TABS: 30.0000 mg | ORAL_TABLET | Freq: Every day | ORAL | 6 refills | Status: DC
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
## 2023-03-16 NOTE — Patient Instructions (Signed)
pre

## 2023-03-17 LAB — CBC WITH DIFFERENTIAL/PLATELET
Basophils Absolute: 0 10*3/uL (ref 0.0–0.2)
Basos: 0 %
EOS (ABSOLUTE): 0 10*3/uL (ref 0.0–0.4)
Eos: 0 %
Hematocrit: 49.8 % (ref 37.5–51.0)
Hemoglobin: 17.2 g/dL (ref 13.0–17.7)
Immature Grans (Abs): 0.2 10*3/uL — ABNORMAL HIGH (ref 0.0–0.1)
Immature Granulocytes: 2 %
Lymphocytes Absolute: 0.9 10*3/uL (ref 0.7–3.1)
Lymphs: 12 %
MCH: 33.6 pg — ABNORMAL HIGH (ref 26.6–33.0)
MCHC: 34.5 g/dL (ref 31.5–35.7)
MCV: 97 fL (ref 79–97)
Monocytes Absolute: 0.3 10*3/uL (ref 0.1–0.9)
Monocytes: 4 %
Neutrophils Absolute: 5.9 10*3/uL (ref 1.4–7.0)
Neutrophils: 82 %
Platelets: 263 10*3/uL (ref 150–450)
RBC: 5.12 x10E6/uL (ref 4.14–5.80)
RDW: 12.3 % (ref 11.6–15.4)
WBC: 7.3 10*3/uL (ref 3.4–10.8)

## 2023-03-17 LAB — COMPREHENSIVE METABOLIC PANEL
ALT: 79 IU/L — ABNORMAL HIGH (ref 0–44)
AST: 78 IU/L — ABNORMAL HIGH (ref 0–40)
Albumin/Globulin Ratio: 1.4 (ref 1.2–2.2)
Albumin: 4.5 g/dL (ref 3.8–4.9)
Alkaline Phosphatase: 55 IU/L (ref 44–121)
BUN/Creatinine Ratio: 19 (ref 9–20)
BUN: 10 mg/dL (ref 6–24)
Bilirubin Total: 0.3 mg/dL (ref 0.0–1.2)
CO2: 25 mmol/L (ref 20–29)
Calcium: 9.8 mg/dL (ref 8.7–10.2)
Chloride: 96 mmol/L (ref 96–106)
Creatinine, Ser: 0.54 mg/dL — ABNORMAL LOW (ref 0.76–1.27)
Globulin, Total: 3.2 g/dL (ref 1.5–4.5)
Glucose: 149 mg/dL — ABNORMAL HIGH (ref 70–99)
Potassium: 4.7 mmol/L (ref 3.5–5.2)
Sodium: 139 mmol/L (ref 134–144)
Total Protein: 7.7 g/dL (ref 6.0–8.5)
eGFR: 118 mL/min/{1.73_m2} (ref >59)

## 2023-03-17 LAB — TSH: TSH: 1.01 u[IU]/mL (ref 0.450–4.500)

## 2023-03-17 LAB — HGB A1C W/O EAG: Hgb A1c MFr Bld: 5.4 % (ref 4.8–5.6)

## 2023-03-17 LAB — CK: Total CK: 1906 U/L (ref 41–331)

## 2023-04-15 IMAGING — DX DG HIP (WITH OR WITHOUT PELVIS) 2-3V*R*
3 series · 3 of 3 positions shown · non-contrast
Comparison: March 08, 2021

CLINICAL DATA: Chronic lower back pain and right hip pain.

EXAM:
DG HIP (WITH OR WITHOUT PELVIS) 2-3V RIGHT

[pelvis ap]
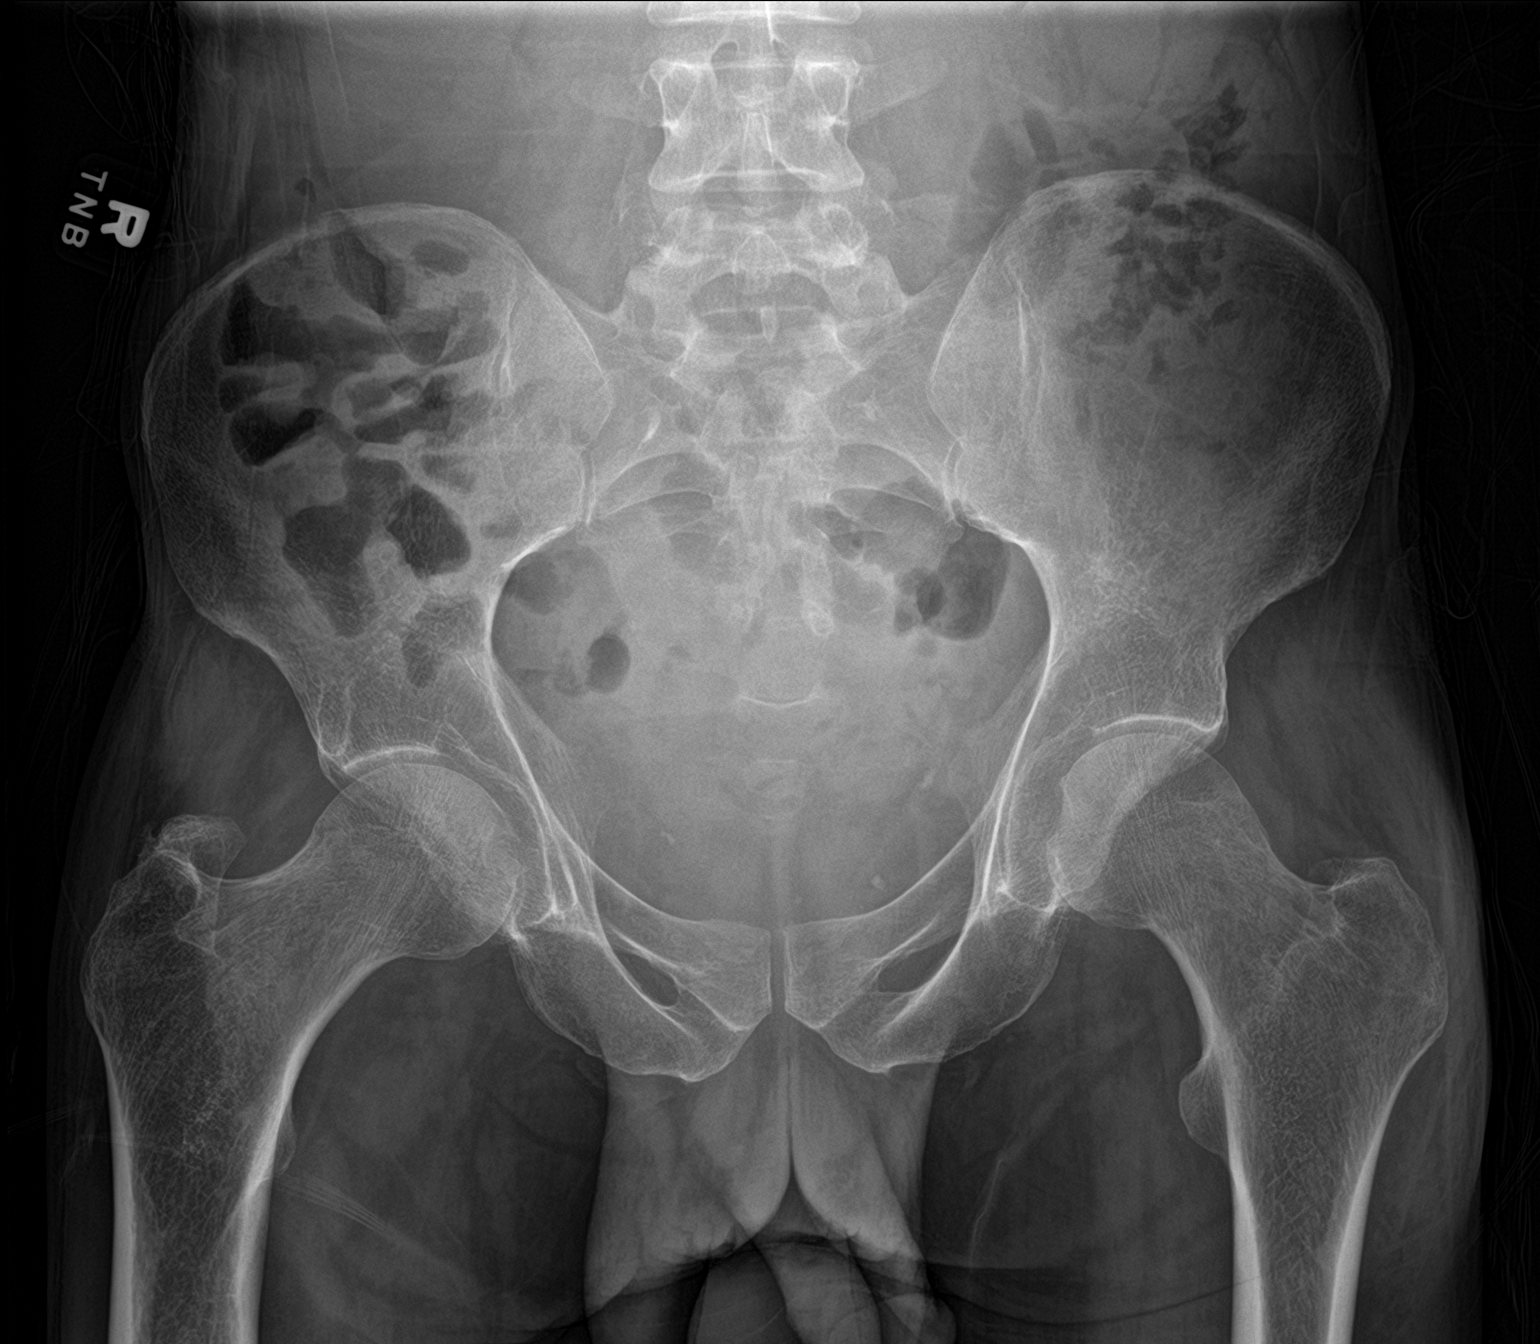

[hip ap]
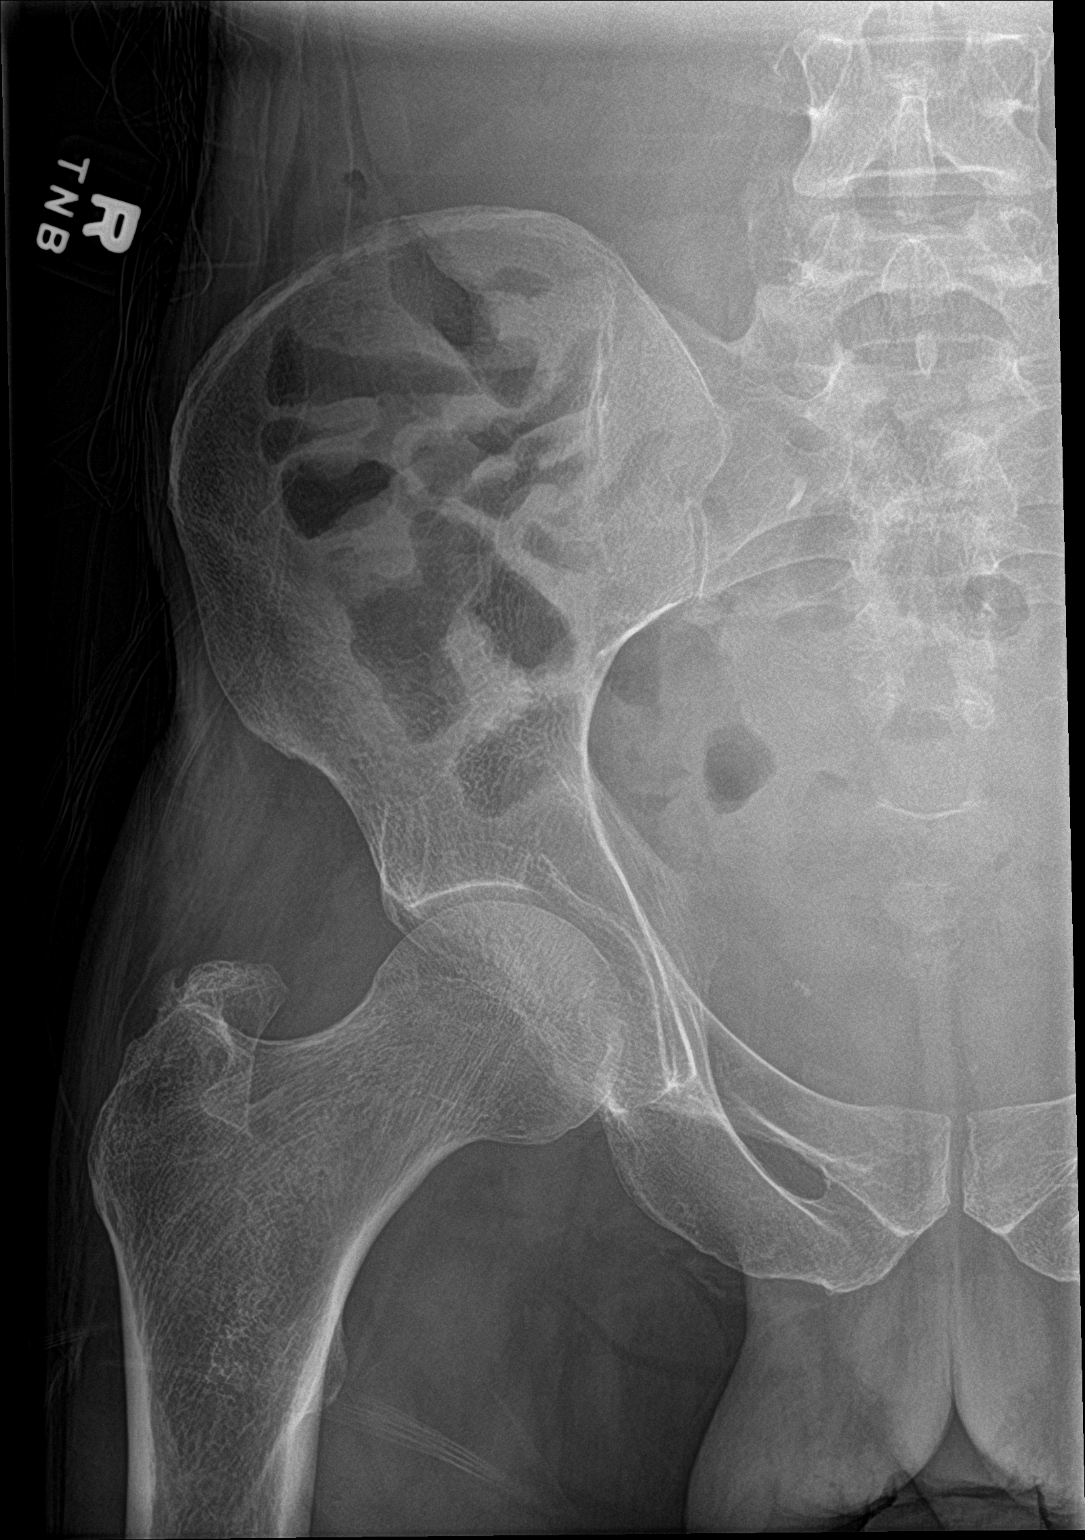

[hip frog leg]
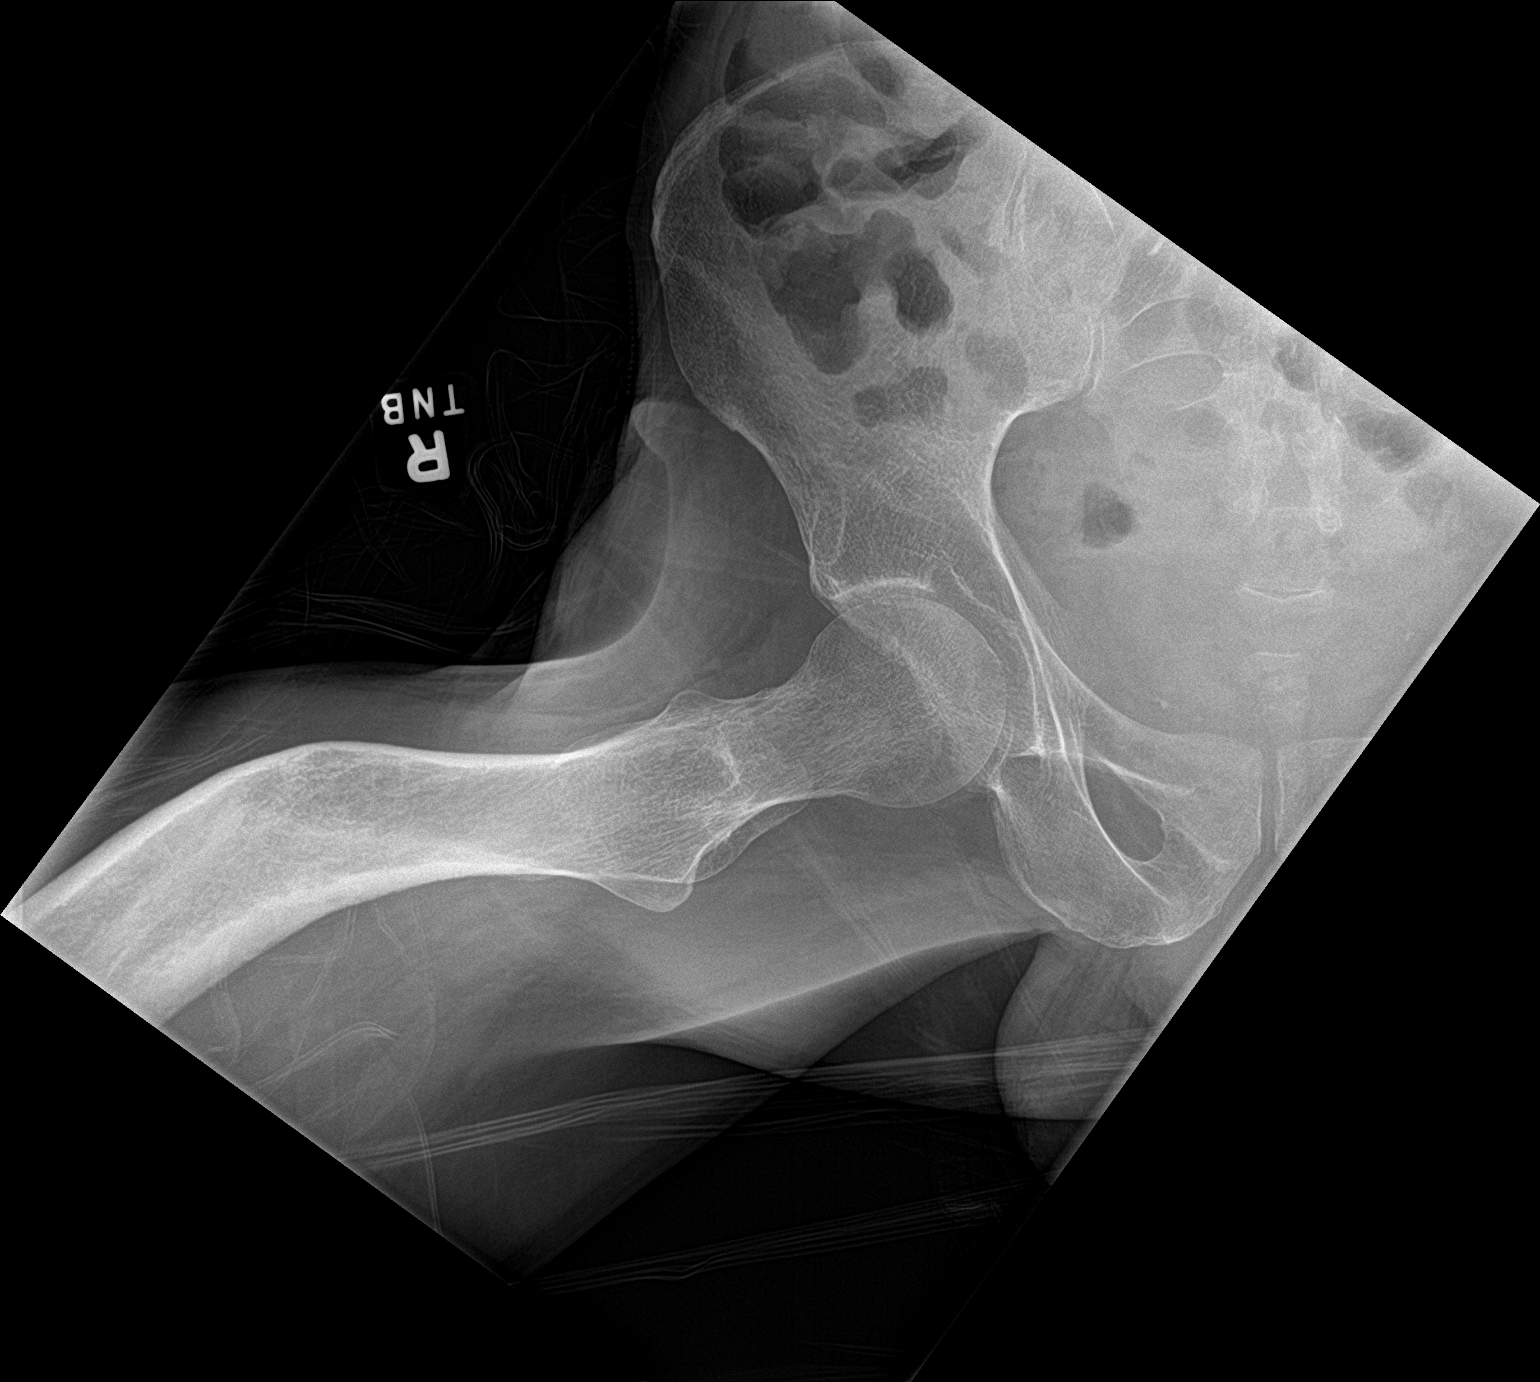

[3 of 3 positions shown; findings below may reference images not displayed]

FINDINGS: There is no evidence of an acute hip fracture or dislocation. A
chronic deformity of the proximal right femoral shaft is noted.
There is no evidence of arthropathy or other focal bone abnormality.
IMPRESSION: No acute osseous abnormality.

## 2023-05-15 ENCOUNTER — Telehealth: Payer: Self-pay | Admitting: Neurology

## 2023-05-15 NOTE — Telephone Encounter (Signed)
Pt requesting a refill on CYMBALTA 60 MG capsule. Refill should be sent to  [facility] [phone]

## 2023-05-18 MED ORDER — DULOXETINE HCL 60 MG PO CPEP: ORAL_CAPSULE | ORAL | 1 refills | Status: DC

## 2023-07-07 ENCOUNTER — Ambulatory Visit: Payer: Medicaid Other | Admitting: Neurology

## 2023-07-30 ENCOUNTER — Other Ambulatory Visit: Payer: Self-pay | Admitting: Neurology

## 2023-08-27 ENCOUNTER — Encounter: Payer: Self-pay | Admitting: Neurology

## 2023-08-27 ENCOUNTER — Ambulatory Visit: Payer: Medicaid Other | Admitting: Neurology

## 2023-08-27 VITALS — BP 144/90 | HR 80 | Ht 67.0 in | Wt 102.4 lb

## 2023-08-27 DIAGNOSIS — R269 Unspecified abnormalities of gait and mobility: Secondary | ICD-10-CM

## 2023-08-27 DIAGNOSIS — M25551 Pain in right hip: Secondary | ICD-10-CM | POA: Diagnosis not present

## 2023-08-27 DIAGNOSIS — G7249 Other inflammatory and immune myopathies, not elsewhere classified: Secondary | ICD-10-CM

## 2023-08-27 MED ORDER — PREDNISONE 10 MG PO TABS: 30.0000 mg | ORAL_TABLET | Freq: Every day | ORAL | 11 refills | Status: DC

## 2023-08-27 MED ORDER — METHOTREXATE SODIUM 2.5 MG PO TABS: 15.0000 mg | ORAL_TABLET | ORAL | 11 refills | Status: DC

## 2023-08-27 MED ORDER — DULOXETINE HCL 60 MG PO CPEP: ORAL_CAPSULE | ORAL | 3 refills | Status: DC
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
## 2023-08-28 LAB — COMPREHENSIVE METABOLIC PANEL
ALT: 58 [IU]/L — ABNORMAL HIGH (ref 0–44)
AST: 71 [IU]/L — ABNORMAL HIGH (ref 0–40)
Albumin: 4.4 g/dL (ref 3.8–4.9)
Alkaline Phosphatase: 66 [IU]/L (ref 44–121)
BUN/Creatinine Ratio: 15 (ref 9–20)
BUN: 7 mg/dL (ref 6–24)
Bilirubin Total: 0.3 mg/dL (ref 0.0–1.2)
CO2: 27 mmol/L (ref 20–29)
Calcium: 9.7 mg/dL (ref 8.7–10.2)
Chloride: 95 mmol/L — ABNORMAL LOW (ref 96–106)
Creatinine, Ser: 0.47 mg/dL — ABNORMAL LOW (ref 0.76–1.27)
Globulin, Total: 2.7 g/dL (ref 1.5–4.5)
Glucose: 104 mg/dL — ABNORMAL HIGH (ref 70–99)
Potassium: 4.5 mmol/L (ref 3.5–5.2)
Sodium: 139 mmol/L (ref 134–144)
Total Protein: 7.1 g/dL (ref 6.0–8.5)
eGFR: 123 mL/min/{1.73_m2} (ref >59)

## 2023-08-28 LAB — CBC WITH DIFFERENTIAL/PLATELET
Basophils Absolute: 0.1 10*3/uL (ref 0.0–0.2)
Basos: 1 %
EOS (ABSOLUTE): 0.1 10*3/uL (ref 0.0–0.4)
Eos: 1 %
Hematocrit: 48.5 % (ref 37.5–51.0)
Hemoglobin: 16.7 g/dL (ref 13.0–17.7)
Immature Grans (Abs): 0.2 10*3/uL — ABNORMAL HIGH (ref 0.0–0.1)
Immature Granulocytes: 2 %
Lymphocytes Absolute: 2.3 10*3/uL (ref 0.7–3.1)
Lymphs: 25 %
MCH: 34.6 pg — ABNORMAL HIGH (ref 26.6–33.0)
MCHC: 34.4 g/dL (ref 31.5–35.7)
MCV: 100 fL — ABNORMAL HIGH (ref 79–97)
Monocytes Absolute: 0.8 10*3/uL (ref 0.1–0.9)
Monocytes: 8 %
Neutrophils Absolute: 6 10*3/uL (ref 1.4–7.0)
Neutrophils: 63 %
Platelets: 349 10*3/uL (ref 150–450)
RBC: 4.83 x10E6/uL (ref 4.14–5.80)
RDW: 13 % (ref 11.6–15.4)
WBC: 9.4 10*3/uL (ref 3.4–10.8)

## 2023-08-28 LAB — VITAMIN D 25 HYDROXY (VIT D DEFICIENCY, FRACTURES): Vit D, 25-Hydroxy: 32.7 ng/mL (ref 30.0–100.0)

## 2023-08-28 LAB — CK: Total CK: 1433 U/L (ref 41–331)

## 2023-08-28 LAB — HGB A1C W/O EAG: Hgb A1c MFr Bld: 5.3 % (ref 4.8–5.6)

## 2023-08-28 LAB — LACTATE DEHYDROGENASE: LDH: 319 [IU]/L — ABNORMAL HIGH (ref 121–224)

## 2023-08-28 LAB — TSH: TSH: 2.95 u[IU]/mL (ref 0.450–4.500)

## 2023-09-01 ENCOUNTER — Telehealth: Payer: Self-pay | Admitting: Neurology

## 2023-09-01 NOTE — Telephone Encounter (Signed)
Please call patient, laboratory evaluation showed elevated CPK 1433, LDH 319, mild elevation of AST 71 ALT 58, consistent with his diagnosis of inflammatory myopathy,   Keep current dose of methotrexate 2.5 mg 6 tablets every week  Increase prednisone to 10 mg 6 tablets for 2 weeks, then 5 tablets for 2 weeks,  Return to repeat laboratory evaluation in November 2024, after 4 weeks of above treatment,  He can come to our office to lab, or we can sent order to lab close to him.

## 2023-09-01 NOTE — Telephone Encounter (Signed)
[employer] medicaid Berkley Harvey: [id number] exp. 09/01/23-10/16/23 sent to [facility] [phone]

## 2023-09-05 LAB — ANTI-HMGCR AB (RDL): Anti-HMGCR Ab (RDL)^: <20 U (ref <20)

## 2023-09-07 ENCOUNTER — Ambulatory Visit (INDEPENDENT_AMBULATORY_CARE_PROVIDER_SITE_OTHER): Payer: Medicaid Other | Admitting: Family Medicine

## 2023-09-07 ENCOUNTER — Encounter: Payer: Self-pay | Admitting: Family Medicine

## 2023-09-07 VITALS — BP 127/82 | HR 85 | Ht 67.0 in | Wt 105.1 lb

## 2023-09-07 DIAGNOSIS — Z1211 Encounter for screening for malignant neoplasm of colon: Secondary | ICD-10-CM | POA: Diagnosis not present

## 2023-09-07 DIAGNOSIS — M6281 Muscle weakness (generalized): Secondary | ICD-10-CM

## 2023-09-07 NOTE — Assessment & Plan Note (Signed)
Encouraged to continue following up with neurology and to take his medication as prescribed

## 2023-09-07 NOTE — Patient Instructions (Signed)
I appreciate the opportunity to provide care to you today!    Follow up:  3 months  Labs:next visit    Attached with your AVS, you will find valuable resources for self-education. I highly recommend dedicating some time to thoroughly examine them.   Please continue to a heart-healthy diet and increase your physical activities. Try to exercise for at least five days a week.    It was a pleasure to see you and I look forward to continuing to work together on your health and well-being. Please do not hesitate to call the office if you need care or have questions about your care.  In case of emergency, please visit the Emergency Department for urgent care, or contact our clinic at [phone] to schedule an appointment. We're here to help you!   Have a wonderful day and week. With Gratitude, Gilmore Laroche MSN, FNP-BC

## 2023-09-07 NOTE — Progress Notes (Signed)
New Patient Office Visit  Subjective:  Patient ID: Bradley Mclaughlin, male    DOB: 09-May-1966  Age: 57 y.o. MRN: [id number]  CC:  Chief Complaint  Patient presents with   New Patient (Initial Visit)    Establishing care, some labs were done by neurologist on 08/27/23.    HPI Bradley Mclaughlin is a 57 y.o. male with past medical history of gait abnormality, weakness of both lower extremity, and muscle weakness presents for establishing care. For the details of today's visit, please refer to the assessment and plan.     Past Medical History:  Diagnosis Date   Bipolar depression (HCC)    Cirrhosis of liver (HCC)    no evidence of cirrhosis on imaging   Myositis     Past Surgical History:  Procedure Laterality Date   FRACTURE SURGERY     femur   HAND SURGERY Right    MUSCLE BIOPSY Left 06/05/2022   Procedure: MUSCLE BIOPSY, DELTOID, DEEP;  Surgeon: Lucretia Roers, MD;  Location: [facility];  Service: General;  Laterality: Left;    Family History  Problem Relation Age of Onset   Cancer Mother    Colon cancer Neg Hx     Social History   Socioeconomic History   Marital status: Single    Spouse name: Not on file   Number of children: Not on file   Years of education: Not on file   Highest education level: Not on file  Occupational History   Not on file  Tobacco Use   Smoking status: Every Day    Current packs/day: 1.00    Types: Cigarettes   Smokeless tobacco: Never   Tobacco comments:    previously smoking 2 ppd  Vaping Use   Vaping status: Never Used  Substance and Sexual Activity   Alcohol use: Not Currently    Comment: usually 1/2 - 1 case a day on the weekends   Drug use: Yes    Types: Marijuana   Sexual activity: Yes    Birth control/protection: None  Other Topics Concern   Not on file  Social History Narrative   Not on file   Social Determinants of Health   Financial Resource Strain: Not on file  Food Insecurity: Not on file  Transportation Needs:  Unmet Transportation Needs (07/28/2022)   PRAPARE - Administrator, Civil Service (Medical): Yes    Lack of Transportation (Non-Medical): Yes  Physical Activity: Not on file  Stress: Not on file  Social Connections: Not on file  Intimate Partner Violence: Not on file    ROS Review of Systems  Constitutional:  Negative for fatigue and fever.  Eyes:  Negative for visual disturbance.  Respiratory:  Negative for chest tightness and shortness of breath.   Cardiovascular:  Negative for chest pain and palpitations.  Neurological:  Negative for dizziness and headaches.    Objective:   Today's Vitals: BP 127/82   Pulse 85   Ht 5\' 7"  (1.702 m)   Wt 105 lb 1.3 oz (47.7 kg)   SpO2 98%   BMI 16.46 kg/m   Physical Exam HENT:     Head: Normocephalic.     Right Ear: External ear normal.     Left Ear: External ear normal.     Nose: No congestion or rhinorrhea.     Mouth/Throat:     Mouth: Mucous membranes are moist.  Cardiovascular:     Rate and Rhythm: Regular rhythm.  Heart sounds: No murmur heard. Pulmonary:     Effort: No respiratory distress.     Breath sounds: Normal breath sounds.  Neurological:     Mental Status: He is alert and oriented to person, place, and time.     Motor: Weakness present.     Gait: Gait abnormal.      Assessment & Plan:   Muscular weakness Assessment & Plan: Encouraged to continue following up with neurology and to take his medication as prescribed   Colon cancer screening -     Cologuard   Note: This chart has been completed using Engineer, civil (consulting) software, and while attempts have been made to ensure accuracy, certain words and phrases may not be transcribed as intended.    Follow-up: Return in about 3 months (around 12/08/2023).   Gilmore Laroche, FNP

## 2023-09-09 LAB — ANTI-JO-1 AB (RDL): Anti-Jo-1 Ab (RDL): <20 U (ref <20)

## 2023-09-10 ENCOUNTER — Other Ambulatory Visit: Payer: Self-pay | Admitting: Neurology

## 2023-09-10 NOTE — Telephone Encounter (Signed)
No longer on med list.. Dc'd by Cory Roughen But you stated: Gabapentin up to 300 mg 3 times a day for pain control

## 2023-09-14 ENCOUNTER — Telehealth: Payer: Self-pay | Admitting: Neurology

## 2023-09-14 MED ORDER — MELOXICAM 15 MG PO TABS: 15.0000 mg | ORAL_TABLET | Freq: Every day | ORAL | 6 refills | Status: DC

## 2023-09-14 NOTE — Telephone Encounter (Signed)
Pt is requesting a refill for meloxicam (MOBIC) 15 MG tablet .  Pharmacy:  [facility] [phone]

## 2023-10-03 ENCOUNTER — Ambulatory Visit (HOSPITAL_COMMUNITY)
Admission: RE | Admit: 2023-10-03 | Discharge: 2023-10-03 | Disposition: A | Discharge status: Home / Self Care | Payer: Medicaid Other | Source: Ambulatory Visit | Attending: Neurology | Admitting: Neurology

## 2023-10-03 DIAGNOSIS — M25551 Pain in right hip: Secondary | ICD-10-CM | POA: Insufficient documentation

## 2023-10-03 DIAGNOSIS — R269 Unspecified abnormalities of gait and mobility: Secondary | ICD-10-CM | POA: Insufficient documentation

## 2023-10-03 DIAGNOSIS — G7249 Other inflammatory and immune myopathies, not elsewhere classified: Secondary | ICD-10-CM | POA: Diagnosis present

## 2023-10-06 ENCOUNTER — Telehealth: Payer: Self-pay | Admitting: Neurology

## 2023-10-06 DIAGNOSIS — G7249 Other inflammatory and immune myopathies, not elsewhere classified: Secondary | ICD-10-CM

## 2023-10-06 NOTE — Telephone Encounter (Signed)
Unable to reach pt over the phone, no VM box. Sent mychart msg asking pt to call back to schedule NCV/EMG  If patient calls back, you can add him on for 11/02/23 at 9:00 AM for NCV, 10:00 AM for EMG

## 2023-10-06 NOTE — Telephone Encounter (Signed)
Pt accepted 12/16 at 9am. Marylene Land will add pt to the schedule.

## 2023-10-08 NOTE — Addendum Note (Signed)
Addended by: Levert Feinstein on: 10/08/2023 05:25 PM   Modules accepted: Orders

## 2023-10-08 NOTE — Telephone Encounter (Signed)
Let him come back to have repeat CPK level, order placed,   Cancel his EMG appt, change to follow up with me at next avaiable

## 2023-10-08 NOTE — Telephone Encounter (Signed)
Pt has called asking the NCV/EMG be cx, he does not want to go thru that again, she is asking a RN calls him to discuss other options.

## 2023-10-12 NOTE — Telephone Encounter (Signed)
Phone room: Plz let pt know that I have released them and that if they still can't process call us while he is there and we can fax them over when he is there to get the labs drawn. Thanks,  Production assistant, radio

## 2023-10-12 NOTE — Telephone Encounter (Signed)
Called and informed pt. Pt would like orders released so that he can get them drawn at the [facility] near [facility]. Please advise.

## 2023-10-16 ENCOUNTER — Other Ambulatory Visit: Payer: Self-pay | Admitting: Neurology

## 2023-10-19 NOTE — Telephone Encounter (Signed)
  Please verify if patient should take gabapentin. I do not see any further documentation regarding the medication.

## 2023-10-28 ENCOUNTER — Telehealth: Payer: Self-pay | Admitting: Family Medicine

## 2023-10-28 ENCOUNTER — Other Ambulatory Visit: Payer: Self-pay | Admitting: Family Medicine

## 2023-10-28 DIAGNOSIS — E559 Vitamin D deficiency, unspecified: Secondary | ICD-10-CM

## 2023-10-28 DIAGNOSIS — E7849 Other hyperlipidemia: Secondary | ICD-10-CM

## 2023-10-28 DIAGNOSIS — R7301 Impaired fasting glucose: Secondary | ICD-10-CM

## 2023-10-28 DIAGNOSIS — E038 Other specified hypothyroidism: Secondary | ICD-10-CM

## 2023-10-28 NOTE — Telephone Encounter (Signed)
Patient came by the office asking does he need to get blood work done before his next visit or day of the visit  01.21.2025. No orders are entered. Contact patient if needs to come in before his next visit. [phone].

## 2023-10-28 NOTE — Telephone Encounter (Signed)
He may return for labs 2-3 days before his next appointment. Orders have been placed.

## 2023-10-28 NOTE — Telephone Encounter (Signed)
Please advice  

## 2023-10-29 NOTE — Telephone Encounter (Signed)
Left detailed vm for pt informing him.

## 2023-11-02 ENCOUNTER — Encounter: Payer: Medicaid Other | Admitting: Neurology

## 2023-11-20 DIAGNOSIS — Z1211 Encounter for screening for malignant neoplasm of colon: Secondary | ICD-10-CM | POA: Diagnosis not present

## 2023-11-26 LAB — COLOGUARD: COLOGUARD: POSITIVE — AB

## 2023-11-27 ENCOUNTER — Other Ambulatory Visit: Payer: Self-pay | Admitting: Family Medicine

## 2023-11-27 DIAGNOSIS — R195 Other fecal abnormalities: Secondary | ICD-10-CM

## 2023-12-01 DIAGNOSIS — E038 Other specified hypothyroidism: Secondary | ICD-10-CM | POA: Diagnosis not present

## 2023-12-01 DIAGNOSIS — R7301 Impaired fasting glucose: Secondary | ICD-10-CM | POA: Diagnosis not present

## 2023-12-01 DIAGNOSIS — E7849 Other hyperlipidemia: Secondary | ICD-10-CM | POA: Diagnosis not present

## 2023-12-01 DIAGNOSIS — E559 Vitamin D deficiency, unspecified: Secondary | ICD-10-CM | POA: Diagnosis not present

## 2023-12-01 DIAGNOSIS — G7249 Other inflammatory and immune myopathies, not elsewhere classified: Secondary | ICD-10-CM | POA: Diagnosis not present

## 2023-12-02 ENCOUNTER — Encounter: Payer: Self-pay | Admitting: Neurology

## 2023-12-02 ENCOUNTER — Ambulatory Visit: Payer: Medicaid Other | Admitting: Neurology

## 2023-12-02 VITALS — BP 118/76 | HR 84 | Ht 67.0 in | Wt 114.0 lb

## 2023-12-02 DIAGNOSIS — R269 Unspecified abnormalities of gait and mobility: Secondary | ICD-10-CM

## 2023-12-02 DIAGNOSIS — R748 Abnormal levels of other serum enzymes: Secondary | ICD-10-CM | POA: Diagnosis not present

## 2023-12-02 DIAGNOSIS — G7249 Other inflammatory and immune myopathies, not elsewhere classified: Secondary | ICD-10-CM | POA: Diagnosis not present

## 2023-12-02 DIAGNOSIS — M25551 Pain in right hip: Secondary | ICD-10-CM

## 2023-12-02 LAB — CBC WITH DIFFERENTIAL/PLATELET
Basophils Absolute: 0.1 10*3/uL (ref 0.0–0.2)
Basos: 1 %
EOS (ABSOLUTE): 0.1 10*3/uL (ref 0.0–0.4)
Eos: 1 %
Hematocrit: 50.5 % (ref 37.5–51.0)
Hemoglobin: 17 g/dL (ref 13.0–17.7)
Immature Grans (Abs): 0.1 10*3/uL (ref 0.0–0.1)
Immature Granulocytes: 1 %
Lymphocytes Absolute: 1.5 10*3/uL (ref 0.7–3.1)
Lymphs: 15 %
MCH: 33.2 pg — ABNORMAL HIGH (ref 26.6–33.0)
MCHC: 33.7 g/dL (ref 31.5–35.7)
MCV: 99 fL — ABNORMAL HIGH (ref 79–97)
Monocytes Absolute: 0.6 10*3/uL (ref 0.1–0.9)
Monocytes: 6 %
Neutrophils Absolute: 7.9 10*3/uL — ABNORMAL HIGH (ref 1.4–7.0)
Neutrophils: 76 %
Platelets: 274 10*3/uL (ref 150–450)
RBC: 5.12 x10E6/uL (ref 4.14–5.80)
RDW: 11.9 % (ref 11.6–15.4)
WBC: 10.3 10*3/uL (ref 3.4–10.8)

## 2023-12-02 LAB — CMP14+EGFR
ALT: 59 [IU]/L — ABNORMAL HIGH (ref 0–44)
AST: 50 [IU]/L — ABNORMAL HIGH (ref 0–40)
Albumin: 4.6 g/dL (ref 3.8–4.9)
Alkaline Phosphatase: 70 [IU]/L (ref 44–121)
BUN/Creatinine Ratio: 29 — ABNORMAL HIGH (ref 9–20)
BUN: 14 mg/dL (ref 6–24)
Bilirubin Total: <0.2 mg/dL (ref 0.0–1.2)
CO2: 27 mmol/L (ref 20–29)
Calcium: 9.5 mg/dL (ref 8.7–10.2)
Chloride: 94 mmol/L — ABNORMAL LOW (ref 96–106)
Creatinine, Ser: 0.49 mg/dL — ABNORMAL LOW (ref 0.76–1.27)
Globulin, Total: 2.9 g/dL (ref 1.5–4.5)
Glucose: 104 mg/dL — ABNORMAL HIGH (ref 70–99)
Potassium: 4.4 mmol/L (ref 3.5–5.2)
Sodium: 138 mmol/L (ref 134–144)
Total Protein: 7.5 g/dL (ref 6.0–8.5)
eGFR: 120 mL/min/{1.73_m2} (ref >59)

## 2023-12-02 LAB — VITAMIN D 25 HYDROXY (VIT D DEFICIENCY, FRACTURES): Vit D, 25-Hydroxy: 15.1 ng/mL — ABNORMAL LOW (ref 30.0–100.0)

## 2023-12-02 LAB — HEMOGLOBIN A1C
Est. average glucose Bld gHb Est-mCnc: 105 mg/dL
Hgb A1c MFr Bld: 5.3 % (ref 4.8–5.6)

## 2023-12-02 LAB — LIPID PANEL
Chol/HDL Ratio: 3.5 {ratio} (ref 0.0–5.0)
Cholesterol, Total: 240 mg/dL — ABNORMAL HIGH (ref 100–199)
HDL: 69 mg/dL (ref >39)
LDL Chol Calc (NIH): 142 mg/dL — ABNORMAL HIGH (ref 0–99)
Triglycerides: 164 mg/dL — ABNORMAL HIGH (ref 0–149)
VLDL Cholesterol Cal: 29 mg/dL (ref 5–40)

## 2023-12-02 LAB — TSH+FREE T4
Free T4: 1.03 ng/dL (ref 0.82–1.77)
TSH: 1.47 u[IU]/mL (ref 0.450–4.500)

## 2023-12-02 LAB — CK: Total CK: 1202 U/L (ref 41–331)

## 2023-12-02 MED ORDER — PREDNISONE 10 MG PO TABS: 60.0000 mg | ORAL_TABLET | Freq: Every day | ORAL | 11 refills | Status: AC

## 2023-12-02 MED ORDER — METHOTREXATE SODIUM 2.5 MG PO TABS: 20.0000 mg | ORAL_TABLET | ORAL | 11 refills | Status: AC

## 2023-12-02 MED ORDER — GABAPENTIN 300 MG PO CAPS: 900.0000 mg | ORAL_CAPSULE | Freq: Three times a day (TID) | ORAL | 11 refills | Status: DC
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
## 2023-12-03 ENCOUNTER — Other Ambulatory Visit: Payer: Self-pay | Admitting: Family Medicine

## 2023-12-03 DIAGNOSIS — E785 Hyperlipidemia, unspecified: Secondary | ICD-10-CM

## 2023-12-03 DIAGNOSIS — E559 Vitamin D deficiency, unspecified: Secondary | ICD-10-CM

## 2023-12-03 MED ORDER — VITAMIN D (ERGOCALCIFEROL) 1.25 MG (50000 UNIT) PO CAPS: 50000.0000 [IU] | ORAL_CAPSULE | ORAL | 0 refills | Status: DC

## 2023-12-03 MED ORDER — ROSUVASTATIN CALCIUM 5 MG PO TABS: 5.0000 mg | ORAL_TABLET | Freq: Every day | ORAL | 1 refills | Status: DC

## 2023-12-03 NOTE — Progress Notes (Signed)
Please inform the patient that a prescription for a weekly vitamin D supplement has been sent to his pharmacy due to low vitamin D levels.  His cholesterol levels are elevated, and I have started him on rosuvastatin 5 mg daily; the prescription has been sent to his pharmacy.  I recommend reducing his intake of greasy, fatty, and starchy foods to help lower his cholesterol levels.  All other lab results are within his baseline.

## 2023-12-04 ENCOUNTER — Other Ambulatory Visit: Payer: Self-pay

## 2023-12-04 DIAGNOSIS — E785 Hyperlipidemia, unspecified: Secondary | ICD-10-CM

## 2023-12-04 DIAGNOSIS — E559 Vitamin D deficiency, unspecified: Secondary | ICD-10-CM

## 2023-12-04 MED ORDER — VITAMIN D (ERGOCALCIFEROL) 1.25 MG (50000 UNIT) PO CAPS: 50000.0000 [IU] | ORAL_CAPSULE | ORAL | 0 refills | Status: AC

## 2023-12-04 MED ORDER — ROSUVASTATIN CALCIUM 5 MG PO TABS: 5.0000 mg | ORAL_TABLET | Freq: Every day | ORAL | 1 refills | Status: DC

## 2023-12-08 ENCOUNTER — Encounter: Payer: Self-pay | Admitting: Neurology

## 2023-12-08 ENCOUNTER — Telehealth: Payer: Self-pay | Admitting: Neurology

## 2023-12-08 ENCOUNTER — Encounter: Payer: Self-pay | Admitting: Family Medicine

## 2023-12-08 ENCOUNTER — Ambulatory Visit (INDEPENDENT_AMBULATORY_CARE_PROVIDER_SITE_OTHER): Payer: Medicaid Other | Admitting: Family Medicine

## 2023-12-08 VITALS — BP 171/103 | HR 68 | Ht 67.0 in | Wt 114.0 lb

## 2023-12-08 DIAGNOSIS — H539 Unspecified visual disturbance: Secondary | ICD-10-CM

## 2023-12-08 DIAGNOSIS — J309 Allergic rhinitis, unspecified: Secondary | ICD-10-CM

## 2023-12-08 DIAGNOSIS — R030 Elevated blood-pressure reading, without diagnosis of hypertension: Secondary | ICD-10-CM

## 2023-12-08 DIAGNOSIS — H1011 Acute atopic conjunctivitis, right eye: Secondary | ICD-10-CM

## 2023-12-08 MED ORDER — MELOXICAM 15 MG PO TABS: 15.0000 mg | ORAL_TABLET | Freq: Every day | ORAL | 6 refills | Status: DC

## 2023-12-08 MED ORDER — AZELASTINE HCL 0.05 % OP SOLN: 1.0000 [drp] | Freq: Two times a day (BID) | OPHTHALMIC | 1 refills | Status: DC

## 2023-12-08 MED ORDER — LEVOCETIRIZINE DIHYDROCHLORIDE 5 MG PO TABS: 5.0000 mg | ORAL_TABLET | Freq: Every evening | ORAL | 1 refills | Status: DC

## 2023-12-08 MED ORDER — GABAPENTIN 600 MG PO TABS: 900.0000 mg | ORAL_TABLET | Freq: Three times a day (TID) | ORAL | 11 refills | Status: AC

## 2023-12-08 MED ORDER — FOLIC ACID 1 MG PO TABS: 1.0000 mg | ORAL_TABLET | Freq: Every day | ORAL | 3 refills | Status: AC

## 2023-12-08 MED ORDER — DULOXETINE HCL 60 MG PO CPEP: ORAL_CAPSULE | ORAL | 3 refills | Status: AC

## 2023-12-08 NOTE — Telephone Encounter (Signed)
Pt said pharmacy informed need something from the neurologist because gabapentin (NEURONTIN) 300 MG capsule is a narcotic so the insurance will cover it . Would like to call back.

## 2023-12-08 NOTE — Assessment & Plan Note (Signed)
The patient's blood pressure is elevated in the clinic, despite no prior diagnosis of hypertension. He is currently asymptomatic. A follow-up appointment is scheduled in one week for nurse visits to monitor his blood pressure. If his blood pressure remains elevated, we will initiate therapy with an antihypertensive medication.  A low-sodium diet of less than 2,300 mg daily is recommended, along with increased physical activity of moderate intensity, aiming for 150 minutes per week. The patient is encouraged to continue these lifestyle modifications to help manage his blood pressure effectively.  The patient was advised to report to the emergency department if his  blood pressure exceeds 180/120 and is accompanied by symptoms such as headaches, chest pain, palpitations, blurred vision, or dizziness.

## 2023-12-08 NOTE — Telephone Encounter (Signed)
THIS WAS SENT BLANK TO ME FROM PHONE ROOM PLZ ADVISE OR MAKE ENCOUNTER ERRONEOUS

## 2023-12-08 NOTE — Progress Notes (Signed)
Established Patient Office Visit  Subjective:  Patient ID: Bradley Mclaughlin, male    DOB: 01-28-1966  Age: 58 y.o. MRN: [id number]  CC:  Chief Complaint  Patient presents with   Medical Management of Chronic Issues    3 month chronic  follow up  Allergies, for the last 3 wk. Eyes (mainly rt. Eye) has been itching, watery, w/ mucus drainage and dryness. Otc eye drops not helping.     HPI Bradley Mclaughlin is a 59 y.o. male with past medical history of bipolar depression resents for f/u of  chronic medical conditions. For the details of today's visit, please refer to the assessment and plan.     Past Medical History:  Diagnosis Date   Bipolar depression (HCC)    Cirrhosis of liver (HCC)    no evidence of cirrhosis on imaging   Myositis     Past Surgical History:  Procedure Laterality Date   FRACTURE SURGERY     [facility]   HAND SURGERY Right    MUSCLE BIOPSY Left 06/05/2022   Procedure: MUSCLE BIOPSY, DELTOID, DEEP;  Surgeon: Lucretia Roers, MD;  Location: [facility];  Service: General;  Laterality: Left;    Family History  Problem Relation Age of Onset   Cancer Mother    Colon cancer Neg Hx     Social History   Socioeconomic History   Marital status: Single    Spouse name: Not on file   Number of children: Not on file   Years of education: Not on file   Highest education level: Not on file  Occupational History   Not on file  Tobacco Use   Smoking status: Every Day    Current packs/day: 1.00    Types: Cigarettes   Smokeless tobacco: Never   Tobacco comments:    smoking 1 ppd  Vaping Use   Vaping status: Never Used  Substance and Sexual Activity   Alcohol use: Not Currently    Comment: usually 1/2 - 1 case a day on the weekends   Drug use: Yes    Types: Marijuana   Sexual activity: Yes    Birth control/protection: None  Other Topics Concern   Not on file  Social History Narrative   Not on file   Social Drivers of Health   Financial Resource Strain: Not  on file  Food Insecurity: Not on file  Transportation Needs: Unmet Transportation Needs (07/28/2022)   PRAPARE - Administrator, Civil Service (Medical): Yes    Lack of Transportation (Non-Medical): Yes  Physical Activity: Not on file  Stress: Not on file  Social Connections: Not on file  Intimate Partner Violence: Not on file    Outpatient Medications Prior to Visit  Medication Sig Dispense Refill   Vitamin D, Ergocalciferol, (DRISDOL) 1.25 MG (50000 UNIT) CAPS capsule Take 1 capsule (50,000 Units total) by mouth every 7 (seven) days. For 8 weeks. Then take over the counter vitamin D (2000 IU) daily. 20 capsule 0   DULoxetine (CYMBALTA) 60 MG capsule TAKE 1 Capsule BY MOUTH ONCE EVERY DAY 90 capsule 3   folic acid (FOLVITE) 1 MG tablet Take 1 tablet (1 mg total) by mouth daily. 90 tablet 3   loratadine (CLARITIN) 10 MG tablet Take 10 mg by mouth daily.     meloxicam (MOBIC) 15 MG tablet Take 1 tablet (15 mg total) by mouth daily. 30 tablet 6   methotrexate (RHEUMATREX) 2.5 MG tablet Take 8 tablets (20 mg  total) by mouth once a week. Caution:Chemotherapy. Protect from light. (Patient not taking: Reported on 12/08/2023) 40 tablet 11   Multiple Vitamins-Minerals (CENTRUM SILVER PO) Take by mouth. (Patient not taking: Reported on 12/08/2023)     predniSONE (DELTASONE) 10 MG tablet Take 6 tablets (60 mg total) by mouth daily with breakfast. (Patient not taking: Reported on 12/08/2023) 180 tablet 11   rosuvastatin (CRESTOR) 5 MG tablet Take 1 tablet (5 mg total) by mouth daily. (Patient not taking: Reported on 12/08/2023) 90 tablet 1   gabapentin (NEURONTIN) 300 MG capsule Take 3 capsules (900 mg total) by mouth 3 (three) times daily. (Patient not taking: Reported on 12/08/2023) 270 capsule 11   No facility-administered medications prior to visit.    No Known Allergies  ROS Review of Systems    Objective:    Physical Exam  BP (!) 171/103   Pulse 68   Ht 5\' 7"  (1.702 m)   Wt  114 lb 0.6 oz (51.7 kg)   SpO2 95%   BMI 17.86 kg/m  Wt Readings from Last 3 Encounters:  12/08/23 114 lb 0.6 oz (51.7 kg)  12/02/23 114 lb (51.7 kg)  09/07/23 105 lb 1.3 oz (47.7 kg)    Lab Results  Component Value Date   TSH 1.470 12/01/2023   Lab Results  Component Value Date   WBC 10.3 12/01/2023   HGB 17.0 12/01/2023   HCT 50.5 12/01/2023   MCV 99 (H) 12/01/2023   PLT 274 12/01/2023   Lab Results  Component Value Date   NA 138 12/01/2023   K 4.4 12/01/2023   CO2 27 12/01/2023   GLUCOSE 104 (H) 12/01/2023   BUN 14 12/01/2023   CREATININE 0.49 (L) 12/01/2023   BILITOT <0.2 12/01/2023   ALKPHOS 70 12/01/2023   AST 50 (H) 12/01/2023   ALT 59 (H) 12/01/2023   PROT 7.5 12/01/2023   ALBUMIN 4.6 12/01/2023   CALCIUM 9.5 12/01/2023   ANIONGAP 7 10/21/2021   EGFR 120 12/01/2023   Lab Results  Component Value Date   CHOL 240 (H) 12/01/2023   Lab Results  Component Value Date   HDL 69 12/01/2023   Lab Results  Component Value Date   LDLCALC 142 (H) 12/01/2023   Lab Results  Component Value Date   TRIG 164 (H) 12/01/2023   Lab Results  Component Value Date   CHOLHDL 3.5 12/01/2023   Lab Results  Component Value Date   HGBA1C 5.3 12/01/2023      Assessment & Plan:  Visual disturbance Assessment & Plan: Snellen visual acuity was completed in the clinic, with the following results: both eyes 20/25, right eye 20/40, and left eye 20/30.  The patient reports constant blurred vision and states that he has not had an eye examination in over two years. He denies eye pain, halos, flashes of light, or floaters. No discharge, swelling around the eyes, or abnormalities in pupil function were noted during the examination.  A referral to ophthalmology has been placed.  Orders: -     Ambulatory referral to Optometry  Allergic conjunctivitis and rhinitis, right Assessment & Plan: The patient complains of itchy, watery eyes with clear discharge for the past three  weeks. He reports using over-the-counter eye drops with minimal relief. No eye redness or crusting upon awakening has been reported.  Therapy with azelastine ophthalmic solution will be initiated, with instructions to apply one drop into the right eye twice daily for seven days. He was also encouraged to take  oral Xyzal 5 mg at bedtime.   Orders: -     Levocetirizine Dihydrochloride; Take 1 tablet (5 mg total) by mouth every evening.  Dispense: 30 tablet; Refill: 1 -     Azelastine HCl; Place 1 drop into the right eye 2 (two) times daily.  Dispense: 6 mL; Refill: 1  Elevated BP without diagnosis of hypertension Assessment & Plan: The patient's blood pressure is elevated in the clinic, despite no prior diagnosis of hypertension. He is currently asymptomatic. A follow-up appointment is scheduled in one week for nurse visits to monitor his blood pressure. If his blood pressure remains elevated, we will initiate therapy with an antihypertensive medication.  A low-sodium diet of less than 2,300 mg daily is recommended, along with increased physical activity of moderate intensity, aiming for 150 minutes per week. The patient is encouraged to continue these lifestyle modifications to help manage his blood pressure effectively.  The patient was advised to report to the emergency department if his  blood pressure exceeds 180/120 and is accompanied by symptoms such as headaches, chest pain, palpitations, blurred vision, or dizziness.    Note: This chart has been completed using Engineer, civil (consulting) software, and while attempts have been made to ensure accuracy, certain words and phrases may not be transcribed as intended.    Follow-up: No follow-ups on file.   Gilmore Laroche, FNP

## 2023-12-08 NOTE — Patient Instructions (Addendum)
I appreciate the opportunity to provide care to you today!    Follow up:  1 week nurse visit for BP  4 months  Start taking Xyzal 5 mg at bedtime to help control your allergy symptoms.  Hypertension Management  Your current blood pressure is above the target goal of <140/90 mmHg. To address this, please continue implementing the following lifestyle changes:  Diet and Lifestyle:Adhere to a low-sodium diet, limiting intake to less than 1,500 mg daily. Increase your physical activity, aiming for at least 150 minutes of moderate-intensity exercise per week.  Hydration and Nutrition:Stay well-hydrated by drinking at least 64 ounces of water daily. Increase your intake of fruits and vegetables, and avoid excessive sodium in your diet. Long-Term Considerations:Uncontrolled hypertension can significantly increase the risk of cardiovascular diseases, including stroke, coronary artery disease, and heart failure.  Emergency Guidance: Please seek immediate care at the emergency department if your blood pressure exceeds 180/120 mmHg and is accompanied by symptoms such as:  Severe headaches Chest pain Palpitations Blurred vision Dizziness These symptoms may indicate a hypertensive crisis and require prompt medical attention.  Attached with your AVS, you will find valuable resources for self-education. I highly recommend dedicating some time to thoroughly examine them.  Referral: Optometry    Please continue to a heart-healthy diet and increase your physical activities. Try to exercise for at least five days a week.    It was a pleasure to see you and I look forward to continuing to work together on your health and well-being. Please do not hesitate to call the office if you need care or have questions about your care.  In case of emergency, please visit the Emergency Department for urgent care, or contact our clinic at [phone] to schedule an appointment. We're here to help you!    Have a wonderful day and week. With Gratitude, Gilmore Laroche MSN, FNP-BC

## 2023-12-08 NOTE — Telephone Encounter (Signed)
 This encounter was created in error - please disregard.

## 2023-12-08 NOTE — Telephone Encounter (Signed)
CALLED AND SPOKE TO [facility] 180 TABS FOR 30 DAYS IS INSURANCE MAX AND RX IS 270.  PA WILL HAVE TO BE DONE   THE PHARMD TOLD ME HAVE THEM SWITCH TO 600MG  1.5 TABS  TIDWHICH WOULD BE A WAY TO REMAIN UNDER THE LIMIT AND NOT HAVE TO DO A PA

## 2023-12-08 NOTE — Telephone Encounter (Signed)
Called and spoke to the pt and explained dose staying the same just tablet size increase to help insurance approved it

## 2023-12-08 NOTE — Addendum Note (Signed)
Addended by: Danne Harbor on: 12/08/2023 03:51 PM   Modules accepted: Orders

## 2023-12-08 NOTE — Assessment & Plan Note (Addendum)
The patient complains of itchy, watery eyes with clear discharge for the past three weeks. He reports using over-the-counter eye drops with minimal relief. No eye redness or crusting upon awakening has been reported.  Therapy with azelastine ophthalmic solution will be initiated, with instructions to apply one drop into the right eye twice daily for seven days. He was also encouraged to take oral Xyzal 5 mg at bedtime.

## 2023-12-08 NOTE — Assessment & Plan Note (Signed)
Snellen visual acuity was completed in the clinic, with the following results: both eyes 20/25, right eye 20/40, and left eye 20/30.  The patient reports constant blurred vision and states that he has not had an eye examination in over two years. He denies eye pain, halos, flashes of light, or floaters. No discharge, swelling around the eyes, or abnormalities in pupil function were noted during the examination.  A referral to ophthalmology has been placed.

## 2023-12-08 NOTE — Telephone Encounter (Signed)
Meds ordered this encounter  Medications   gabapentin (NEURONTIN) 600 MG tablet    Sig: Take 1.5 tablets (900 mg total) by mouth 3 (three) times daily.    Dispense:  140 tablet    Refill:  11

## 2023-12-13 ENCOUNTER — Other Ambulatory Visit: Payer: Self-pay | Admitting: Family Medicine

## 2023-12-13 DIAGNOSIS — E7849 Other hyperlipidemia: Secondary | ICD-10-CM

## 2023-12-13 MED ORDER — ATORVASTATIN CALCIUM 20 MG PO TABS: 20.0000 mg | ORAL_TABLET | Freq: Every day | ORAL | 3 refills | Status: AC

## 2023-12-15 ENCOUNTER — Telehealth: Payer: Self-pay

## 2023-12-15 NOTE — Telephone Encounter (Signed)
Copied from CRM [id number]. Topic: General - Other >> Dec 15, 2023  2:23 PM Geneva B wrote: Reason for CRM: patient is calling in because he was told to keep track of his blood pressure took bp 12/09/23 140/60 and its been about the same ever since please call pATIENT [phone]

## 2023-12-17 NOTE — Telephone Encounter (Addendum)
Please inform the patient that a prescription for a weekly vitamin D supplement has been sent to his pharmacy due to low vitamin D levels.  His cholesterol levels are elevated, and I have started him on Atorvastatin 20mg  daily; the prescription has been sent to his pharmacy.  I recommend reducing his intake of greasy, fatty, and starchy foods to help lower his cholesterol levels.  All other lab results are within his baseline.

## 2023-12-21 DIAGNOSIS — H04123 Dry eye syndrome of bilateral lacrimal glands: Secondary | ICD-10-CM | POA: Diagnosis not present

## 2023-12-21 DIAGNOSIS — H2513 Age-related nuclear cataract, bilateral: Secondary | ICD-10-CM | POA: Diagnosis not present

## 2023-12-21 DIAGNOSIS — H462 Nutritional optic neuropathy: Secondary | ICD-10-CM | POA: Diagnosis not present

## 2023-12-21 DIAGNOSIS — H5310 Unspecified subjective visual disturbances: Secondary | ICD-10-CM | POA: Diagnosis not present

## 2023-12-21 DIAGNOSIS — H1045 Other chronic allergic conjunctivitis: Secondary | ICD-10-CM | POA: Diagnosis not present

## 2023-12-29 ENCOUNTER — Telehealth: Payer: Self-pay | Admitting: Pharmacist

## 2023-12-29 ENCOUNTER — Other Ambulatory Visit (HOSPITAL_COMMUNITY): Payer: Self-pay

## 2023-12-29 NOTE — Therapy (Incomplete)
OUTPATIENT PHYSICAL THERAPY LOWER EXTREMITY EVALUATION   Patient Name: Bradley Mclaughlin MRN: [id number] DOB:[birth year], 58 y.o., male Today's Date: 12/29/2023  END OF SESSION:   Past Medical History:  Diagnosis Date   Bipolar depression (HCC)    Cirrhosis of liver (HCC)    no evidence of cirrhosis on imaging   Myositis    Past Surgical History:  Procedure Laterality Date   FRACTURE SURGERY     [facility]   HAND SURGERY Right    MUSCLE BIOPSY Left 06/05/2022   Procedure: MUSCLE BIOPSY, DELTOID, DEEP;  Surgeon: Lucretia Roers, MD;  Location: [facility];  Service: General;  Laterality: Left;   Patient Active Problem List   Diagnosis Date Noted   Visual disturbance 12/08/2023   Allergic conjunctivitis and rhinitis, right 12/08/2023   Elevated BP without diagnosis of hypertension 12/08/2023   Loss of weight 07/30/2022   Colon cancer screening 07/30/2022   Vitamin D deficiency 07/30/2022   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  Rationale for Evaluation and Treatment: Rehabilitation  ONSET DATE: ***  SUBJECTIVE:   SUBJECTIVE STATEMENT: ***  PERTINENT HISTORY: *** PAIN:  Are you having pain? {OPRCPAIN:27236}  PRECAUTIONS: {Therapy precautions:24002}  RED FLAGS: {PT Red Flags:29287}   WEIGHT BEARING RESTRICTIONS: {Yes ***/No:24003}  FALLS:  Has patient fallen in last 6 months? {fallsyesno:27318}  LIVING ENVIRONMENT: Lives with: {OPRC lives with:25569::"lives with their family"} Lives in: {Lives in:25570} Stairs: {opstairs:27293} Has  following equipment at home: {Assistive devices:23999}  OCCUPATION: ***  PLOF: {PLOF:24004}  PATIENT GOALS: ***  NEXT MD VISIT: ***  OBJECTIVE:  Note: Objective measures were completed at Evaluation unless otherwise noted.  DIAGNOSTIC FINDINGS: ***  PATIENT SURVEYS:  LEFS ***  COGNITION: Overall cognitive status: {cognition:24006}     SENSATION: {sensation:27233}  EDEMA:  {edema:24020}  MUSCLE LENGTH: Hamstrings: Right *** deg; Left *** deg Maisie Fus test: Right *** deg; Left *** deg  POSTURE: {posture:25561}  PALPATION: ***  LOWER EXTREMITY ROM:  {AROM/PROM:27142} ROM Right eval Left eval  Hip flexion    Hip extension    Hip abduction    Hip adduction    Hip internal rotation    Hip external rotation    Knee flexion    Knee extension    Ankle dorsiflexion    Ankle plantarflexion    Ankle inversion    Ankle eversion     (Blank rows = not tested)  LOWER EXTREMITY MMT:  MMT Right eval Left eval  Hip flexion    Hip extension    Hip abduction    Hip adduction    Hip internal rotation    Hip external rotation    Knee flexion    Knee extension    Ankle dorsiflexion    Ankle plantarflexion    Ankle inversion    Ankle eversion     (Blank rows = not tested)  LOWER EXTREMITY SPECIAL TESTS:  {LEspecialtests:26242}  FUNCTIONAL TESTS:  {Functional tests:24029}  GAIT: Distance walked: *** Assistive device utilized: {Assistive devices:23999} Level of assistance: {Levels of assistance:24026} Comments: ***  TREATMENT DATE:  12/30/23 Evaluation and patient education done    PATIENT EDUCATION:  Education details: Educated on the pathoanatomy of _. Educated on the goals and course of rehab. Person educated: {Person educated:25204} Education method: {Education Method:25205} Education comprehension: {Education  Comprehension:25206}  HOME EXERCISE PROGRAM: ***  ASSESSMENT:  CLINICAL IMPRESSION: Patient is a 58 y.o. male who was seen today for physical therapy evaluation and treatment for inflammatory myopathy. Patient's condition is provider further defined by _ due to pain, weakness, and decreased soft tissue extensibility. Skilled PT is required to address the impairments and functional limitations listed below.    OBJECTIVE IMPAIRMENTS: {opptimpairments:25111}.   ACTIVITY LIMITATIONS: {activitylimitations:27494}  PARTICIPATION LIMITATIONS: {participationrestrictions:25113}  PERSONAL FACTORS: {Personal factors:25162} are also affecting patient's functional outcome.   REHAB POTENTIAL: {rehabpotential:25112}  CLINICAL DECISION MAKING: {clinical decision making:25114}  EVALUATION COMPLEXITY: {Evaluation complexity:25115}   GOALS: Goals reviewed with patient? Yes  SHORT TERM GOALS: Target date: *** *** Baseline: Goal status: INITIAL  2.  *** Baseline:  Goal status: INITIAL  3.  *** Baseline:  Goal status: INITIAL  4.  *** Baseline:  Goal status: INITIAL  5.  *** Baseline:  Goal status: INITIAL  6.  *** Baseline:  Goal status: INITIAL  LONG TERM GOALS: Target date: ***  *** Baseline:  Goal status: INITIAL  2.  *** Baseline:  Goal status: INITIAL  3.  *** Baseline:  Goal status: INITIAL  4.  *** Baseline:  Goal status: INITIAL  5.  *** Baseline:  Goal status: INITIAL  6.  *** Baseline:  Goal status: INITIAL   PLAN:  PT FREQUENCY: {rehab frequency:25116}  PT DURATION: {rehab duration:25117}  PLANNED INTERVENTIONS: {rehab planned interventions:25118::"97110-Therapeutic exercises","97530- Therapeutic [phone]- Neuromuscular re-education","97535- Self WGNF","62130- Manual therapy"}  PLAN FOR NEXT SESSION: ***   Aleks Nawrot L. Jaysha Lasure, PT, DPT, OCS Board-Certified Clinical Specialist in Orthopedic PT PT Compact Privilege # ([place]):  [phone] T 12/29/2023, 4:31 PM

## 2023-12-29 NOTE — Telephone Encounter (Signed)
Per test claim, a prior authorization is not needed for Gabapenting 600mg  at a total daily dose of 900mg .

## 2023-12-30 ENCOUNTER — Telehealth (HOSPITAL_COMMUNITY): Payer: Self-pay

## 2023-12-30 ENCOUNTER — Ambulatory Visit (HOSPITAL_COMMUNITY): Payer: Medicaid Other | Attending: Neurology

## 2023-12-30 DIAGNOSIS — M6281 Muscle weakness (generalized): Secondary | ICD-10-CM | POA: Insufficient documentation

## 2023-12-30 DIAGNOSIS — Z7409 Other reduced mobility: Secondary | ICD-10-CM | POA: Insufficient documentation

## 2023-12-30 DIAGNOSIS — G7249 Other inflammatory and immune myopathies, not elsewhere classified: Secondary | ICD-10-CM | POA: Insufficient documentation

## 2023-12-30 NOTE — Telephone Encounter (Signed)
Called patient today to follow up with his appointment as he did not show up for his initial evaluation. Patient did not pick up the phone and a voice message was left.  Tish Frederickson. Chasyn Cinque, PT, DPT, OCS Board-Certified Clinical Specialist in Orthopedic PT PT Compact Privilege # ([place]): [phone] T

## 2024-01-15 ENCOUNTER — Other Ambulatory Visit: Payer: Self-pay

## 2024-01-15 ENCOUNTER — Encounter (HOSPITAL_COMMUNITY): Payer: Self-pay

## 2024-01-15 ENCOUNTER — Ambulatory Visit (HOSPITAL_COMMUNITY): Payer: Medicaid Other

## 2024-01-15 DIAGNOSIS — M6281 Muscle weakness (generalized): Secondary | ICD-10-CM

## 2024-01-15 DIAGNOSIS — Z7409 Other reduced mobility: Secondary | ICD-10-CM

## 2024-01-15 DIAGNOSIS — G7249 Other inflammatory and immune myopathies, not elsewhere classified: Secondary | ICD-10-CM | POA: Diagnosis present
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
## 2024-01-18 DIAGNOSIS — H5213 Myopia, bilateral: Secondary | ICD-10-CM | POA: Diagnosis not present

## 2024-01-20 ENCOUNTER — Encounter (HOSPITAL_COMMUNITY): Payer: Medicaid Other

## 2024-01-21 ENCOUNTER — Encounter (INDEPENDENT_AMBULATORY_CARE_PROVIDER_SITE_OTHER): Payer: Self-pay | Admitting: *Deleted

## 2024-02-01 ENCOUNTER — Other Ambulatory Visit: Payer: Self-pay | Admitting: Family Medicine

## 2024-02-01 DIAGNOSIS — H1011 Acute atopic conjunctivitis, right eye: Secondary | ICD-10-CM

## 2024-02-03 ENCOUNTER — Ambulatory Visit (HOSPITAL_COMMUNITY): Payer: Medicaid Other | Attending: Neurology

## 2024-02-03 DIAGNOSIS — Z7409 Other reduced mobility: Secondary | ICD-10-CM | POA: Diagnosis present

## 2024-02-03 DIAGNOSIS — M6281 Muscle weakness (generalized): Secondary | ICD-10-CM | POA: Insufficient documentation

## 2024-02-03 DIAGNOSIS — G7249 Other inflammatory and immune myopathies, not elsewhere classified: Secondary | ICD-10-CM | POA: Insufficient documentation
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
## 2024-02-09 ENCOUNTER — Ambulatory Visit (HOSPITAL_COMMUNITY): Payer: Medicaid Other

## 2024-02-09 ENCOUNTER — Encounter (HOSPITAL_COMMUNITY): Payer: Self-pay

## 2024-02-09 DIAGNOSIS — H462 Nutritional optic neuropathy: Secondary | ICD-10-CM | POA: Diagnosis not present

## 2024-02-09 DIAGNOSIS — G7249 Other inflammatory and immune myopathies, not elsewhere classified: Secondary | ICD-10-CM

## 2024-02-09 DIAGNOSIS — M6281 Muscle weakness (generalized): Secondary | ICD-10-CM

## 2024-02-09 DIAGNOSIS — Z7409 Other reduced mobility: Secondary | ICD-10-CM
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
## 2024-02-12 ENCOUNTER — Encounter (HOSPITAL_COMMUNITY): Payer: Medicaid Other

## 2024-02-16 ENCOUNTER — Encounter (HOSPITAL_COMMUNITY): Payer: Medicaid Other

## 2024-02-18 ENCOUNTER — Encounter (HOSPITAL_COMMUNITY): Payer: Medicaid Other

## 2024-02-23 ENCOUNTER — Encounter (HOSPITAL_COMMUNITY): Payer: Medicaid Other

## 2024-02-23 ENCOUNTER — Encounter (HOSPITAL_COMMUNITY): Payer: Self-pay

## 2024-02-23 NOTE — Therapy (Signed)
 [facility] Outpatient Rehabilitation at [facility] [address] [place], [place], [address] Phone: [phone]   Fax:  [phone]  Patient Details  Name: Bradley Mclaughlin MRN: [id number] Date of Birth: 12/23/1965 Referring Provider:  No ref. provider found  Encounter Date: 02/23/2024  PHYSICAL THERAPY DISCHARGE SUMMARY  Visits from Start of Care: 3  Current functional level related to goals / functional outcomes: Unknown   Remaining deficits: Unknown   Education / Equipment: NA   Patient agrees to discharge. Patient goals were not met. Patient is being discharged due to not returning since the last visit.   Nelida Meuse, PT 02/23/2024, 1:50 PM  [facility] Outpatient Rehabilitation at [facility] [address] [place], [place], [address] Phone: [phone]   Fax:  [phone]

## 2024-02-24 ENCOUNTER — Telehealth (INDEPENDENT_AMBULATORY_CARE_PROVIDER_SITE_OTHER): Admitting: Family Medicine

## 2024-02-24 ENCOUNTER — Encounter: Payer: Self-pay | Admitting: Family Medicine

## 2024-02-24 ENCOUNTER — Ambulatory Visit: Payer: Self-pay

## 2024-02-24 DIAGNOSIS — K047 Periapical abscess without sinus: Secondary | ICD-10-CM | POA: Diagnosis not present

## 2024-02-24 MED ORDER — AMOXICILLIN-POT CLAVULANATE 875-125 MG PO TABS: 1.0000 | ORAL_TABLET | Freq: Two times a day (BID) | ORAL | 0 refills | Status: DC

## 2024-02-24 NOTE — Telephone Encounter (Signed)
  Chief Complaint: tooth pain Symptoms: pain, swelling Frequency: constant  Pertinent Negatives: Patient denies fever Disposition: [] ED /[] Urgent Care (no appt availability in office) / [x] Appointment(In office/virtual)/ []  [place] [facility]/ [] Home Care/ [] Refused Recommended Disposition /[] [facility]/ []  Follow-up with PCP Additional Notes:  Feels he has an abscess tooth, has blister at site on his gum, upper left tooth. Has facial swelling left sided, blurry vision left eye. Symptoms started 2 days ago of pain and swelling. He is not established with a dental provider, plans to call insurance to find dentist that is covered with his insurance.  Using OTC pain reliever without effect. Requesting antibiotics. Acute evaluation advised, patient has transport issues, scheduled next available acute virtual visit with alternate provider/location. Educated on care advice as documented in protocol, patient verbalized understanding. Assisted with MyChart log in.    Message from Wedron C sent at 02/24/2024 12:46 PM EDT  Summary: oral discomfort   Copied From CRM [id number]. Reason for Triage: The patient has began to experience oral discomfort in the upper left region of their mouth  The patient shares that they're concerned they may be experiencing an abscessed tooth  The patient would like to be prescribed an antibiotic to help with their discomfort  Please contact the patient further when possible      Reason for Disposition  Face is very swollen  Protocols used: Toothache-A-AH

## 2024-02-24 NOTE — Telephone Encounter (Signed)
 Pt completed virtual visit with [facility]

## 2024-02-24 NOTE — Progress Notes (Signed)
 Virtual Visit via MyChart video note  I connected with Bradley Mclaughlin on 02/24/24 at 1341 by video and verified that I am speaking with the correct person using two identifiers. Bradley Mclaughlin is currently located at home and patient are currently with her during visit. The provider, Elige Radon Taegen Lennox, MD is located in their office at time of visit.  Call ended at 1349  I discussed the limitations, risks, security and privacy concerns of performing an evaluation and management service by video and the availability of in person appointments. I also discussed with the patient that there may be a patient responsible charge related to this service. The patient expressed understanding and agreed to proceed.   History and Present Illness: Patient is calling in for tooth pain and abscess tooth. He says it started 2 days ago.  He denies any fevers or chills or headaches or body aches.  He had problems in the past. He has facial swelling and loose and rotting teeth.  He has not seen a dentist in many years.   1. Dental infection     Outpatient Encounter Medications as of 02/24/2024  Medication Sig   amoxicillin-clavulanate (AUGMENTIN) 875-125 MG tablet Take 1 tablet by mouth 2 (two) times daily.   atorvastatin (LIPITOR) 20 MG tablet Take 1 tablet (20 mg total) by mouth daily.   azelastine (OPTIVAR) 0.05 % ophthalmic solution Place 1 drop into the right eye 2 (two) times daily.   DULoxetine (CYMBALTA) 60 MG capsule TAKE 1 Capsule BY MOUTH ONCE EVERY DAY   folic acid (FOLVITE) 1 MG tablet Take 1 tablet (1 mg total) by mouth daily.   gabapentin (NEURONTIN) 600 MG tablet Take 1.5 tablets (900 mg total) by mouth 3 (three) times daily.   levocetirizine (XYZAL) 5 MG tablet Take 1 tablet (5 mg total) by mouth every evening.   meloxicam (MOBIC) 15 MG tablet Take 1 tablet (15 mg total) by mouth daily.   methotrexate (RHEUMATREX) 2.5 MG tablet Take 8 tablets (20 mg total) by mouth once a week.  Caution:Chemotherapy. Protect from light. (Patient not taking: Reported on 12/08/2023)   Multiple Vitamins-Minerals (CENTRUM SILVER PO) Take by mouth. (Patient not taking: Reported on 12/08/2023)   predniSONE (DELTASONE) 10 MG tablet Take 6 tablets (60 mg total) by mouth daily with breakfast. (Patient not taking: Reported on 12/08/2023)   Vitamin D, Ergocalciferol, (DRISDOL) 1.25 MG (50000 UNIT) CAPS capsule Take 1 capsule (50,000 Units total) by mouth every 7 (seven) days. For 8 weeks. Then take over the counter vitamin D (2000 IU) daily.   No facility-administered encounter medications on file as of 02/24/2024.    Review of Systems  Constitutional:  Negative for chills and fever.  HENT:  Positive for dental problem and facial swelling.   Eyes:  Negative for discharge.  Respiratory:  Negative for shortness of breath and wheezing.   Cardiovascular:  Negative for chest pain and leg swelling.  Musculoskeletal:  Negative for back pain and gait problem.  Skin:  Negative for rash.  All other systems reviewed and are negative.   Observations/Objective: Patient sounds comfortable and in no acute distress  Assessment and Plan: Problem List Items Addressed This Visit   None Visit Diagnoses       Dental infection    -  Primary   Relevant Medications   amoxicillin-clavulanate (AUGMENTIN) 875-125 MG tablet   Other Relevant Orders   Ambulatory referral to Dentistry       Sent antibiotic for dental  infection/abscess recommended to begin with dentist as soon as he can.  Call back if worsens or does not improve Follow up plan: Return if symptoms worsen or fail to improve.     I discussed the assessment and treatment plan with the patient. The patient was provided an opportunity to ask questions and all were answered. The patient agreed with the plan and demonstrated an understanding of the instructions.   The patient was advised to call back or seek an in-person evaluation if the symptoms  worsen or if the condition fails to improve as anticipated.  The above assessment and management plan was discussed with the patient. The patient verbalized understanding of and has agreed to the management plan. Patient is aware to call the clinic if symptoms persist or worsen. Patient is aware when to return to the clinic for a follow-up visit. Patient educated on when it is appropriate to go to the emergency department.    I provided 8 minutes of non-face-to-face time during this encounter.    Nils Pyle, MD

## 2024-02-25 ENCOUNTER — Encounter (HOSPITAL_COMMUNITY): Payer: Medicaid Other

## 2024-03-01 ENCOUNTER — Encounter (HOSPITAL_COMMUNITY): Payer: Medicaid Other

## 2024-03-03 ENCOUNTER — Encounter (HOSPITAL_COMMUNITY): Payer: Medicaid Other

## 2024-03-08 ENCOUNTER — Encounter (HOSPITAL_COMMUNITY): Payer: Medicaid Other

## 2024-03-10 ENCOUNTER — Encounter (HOSPITAL_COMMUNITY): Payer: Medicaid Other

## 2024-03-15 ENCOUNTER — Telehealth: Payer: Self-pay | Admitting: Neurology

## 2024-03-15 ENCOUNTER — Telehealth: Payer: Medicaid Other | Admitting: Neurology

## 2024-03-15 ENCOUNTER — Encounter (HOSPITAL_COMMUNITY): Payer: Medicaid Other

## 2024-03-15 DIAGNOSIS — R748 Abnormal levels of other serum enzymes: Secondary | ICD-10-CM | POA: Diagnosis not present

## 2024-03-15 DIAGNOSIS — R269 Unspecified abnormalities of gait and mobility: Secondary | ICD-10-CM

## 2024-03-15 DIAGNOSIS — M25551 Pain in right hip: Secondary | ICD-10-CM | POA: Diagnosis not present

## 2024-03-15 DIAGNOSIS — G7249 Other inflammatory and immune myopathies, not elsewhere classified: Secondary | ICD-10-CM

## 2024-03-15 NOTE — Telephone Encounter (Signed)
 ..  Pt understands that although there may be some limitations with this type of visit, we will take all precautions to reduce any security or privacy concerns.  Pt understands that this will be treated like an in office visit and we will file with pt's insurance, and there may be a patient responsible charge related to this service. ? ?

## 2024-03-15 NOTE — Telephone Encounter (Signed)
 I ordered repeat lab cmp, cbc, cpk, A1c, he would like to do it at local [facility], please help coordinate, Lab one week prior to next appt  Return in person in 3 months,

## 2024-03-15 NOTE — Telephone Encounter (Signed)
 Dr. Gracie Lav would like to see pt back IN PERSON in 3 months. LVM and sent MyChart msg asking him to call back and schedule.
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
## 2024-03-22 ENCOUNTER — Encounter (HOSPITAL_COMMUNITY): Payer: Medicaid Other

## 2024-03-24 ENCOUNTER — Encounter (HOSPITAL_COMMUNITY): Payer: Medicaid Other

## 2024-03-28 DIAGNOSIS — H524 Presbyopia: Secondary | ICD-10-CM | POA: Diagnosis not present

## 2024-04-08 ENCOUNTER — Ambulatory Visit: Admitting: Family Medicine

## 2024-04-08 ENCOUNTER — Ambulatory Visit: Payer: Medicaid Other | Admitting: Family Medicine

## 2024-04-08 ENCOUNTER — Ambulatory Visit: Payer: Self-pay | Admitting: *Deleted

## 2024-04-08 NOTE — Telephone Encounter (Signed)
 Called patient he said he will wait and keep the appointment for 07.17.2025

## 2024-04-08 NOTE — Telephone Encounter (Signed)
 If he wants to be seen for the fall can be scheduled with Rice Chamorro for acute visit

## 2024-04-08 NOTE — Telephone Encounter (Signed)
 Copied from CRM [id number]. Topic: Clinical - Red Word Triage >> Apr 08, 2024  9:26 AM Rennis Case wrote: Red Word that prompted transfer to Nurse Triage: fell about 3 days ago, swollen on left side of chest and sore Reason for Disposition  [1] Chest pain(s) lasting a few seconds AND [2] persists > 3 days    Fell 3 days ago on concrete slab onto left side.   If I move a certain way it hurts in my left chest and it's swollen over the area.   No bruising.  Answer Assessment - Initial Assessment Questions 1. LOCATION: "Where does it hurt?"       Pt called in to change his appt.    I came out of my neighbor's house.   When I turned I fell on my left side onto concrete slab.   EMS came.  I didn't need to go to the ED. I have pain I have swelling over my left chest area.   If it's a broken rib I know they can't do anything for that. I don't see any bruising just a little swelling.   When I cough it hurts but not with a deep breath.     I had an appt at 10:00 this morning and I had to arrange transportation.   They are late so I'm not going to make to the appt this morning with y'all.   I need to reschedule.  It was for a regular check up. 2. RADIATION: "Does the pain go anywhere else?" (e.g., into neck, jaw, arms, back)     No 3. ONSET: "When did the chest pain begin?" (Minutes, hours or days)      3 days ago 4. PATTERN: "Does the pain come and go, or has it been constant since it started?"  "Does it get worse with exertion?"      Swelling over left chest area 5. DURATION: "How long does it last" (e.g., seconds, minutes, hours)     Since I fell 3 days ago 6. SEVERITY: "How bad is the pain?"  (e.g., Scale 1-10; mild, moderate, or severe)    - MILD (1-3): doesn't interfere with normal activities     - MODERATE (4-7): interferes with normal activities or awakens from sleep    - SEVERE (8-10): excruciating pain, unable to do any normal activities       Pain with coughing. 7. CARDIAC RISK FACTORS: "Do you  have any history of heart problems or risk factors for heart disease?" (e.g., angina, prior heart attack; diabetes, high blood pressure, high cholesterol, smoker, or strong family history of heart disease)     Not asked 8. PULMONARY RISK FACTORS: "Do you have any history of lung disease?"  (e.g., blood clots in lung, asthma, emphysema, birth control pills)     Not asked 9. CAUSE: "What do you think is causing the chest pain?"     I fell onto a concrete slab 3 days ago 10. OTHER SYMPTOMS: "Do you have any other symptoms?" (e.g., dizziness, nausea, vomiting, sweating, fever, difficulty breathing, cough)       No 11. PREGNANCY: "Is there any chance you are pregnant?" "When was your last menstrual period?"       N/A  Protocols used: Chest Pain-A-AH  Chief Complaint: Pt called in to change his appt because the transportation service is running late and can't get him here in time for his 10:00 appt this morning.  It was for his routine check up.  I got him rescheduled for 06/02/2024 with Gloria Zarwolo, FNP at 1:20.   While on the phone with Patient Access Specialist he mentioned during her questions that he fell 3 days ago and is sore in his left chest area with swelling so was transferred to me for triage.  Symptoms: He fell 3 days ago on a concrete slab when his feet got tangled up and he fell on his left side.    EMS was called.   Everything was fine.   Did not go to ED.  He is c/o pain mainly when he coughs and moves certain ways and having swelling over the area in left chest.   No bruising or pain with deep breathing.  Denies shortness of breath or other injuries.  He does not have transportation.  He has to give a 48 hour notice at least to arrange transportation.  I went over the s/s to call 911 for:  shortness of breath, increased pain or swelling, sharp pain develops, fever, coughing.   He verbalized understanding and was agreeable to calling 911 if these symptoms developed or he got worse.    Frequency: Fell 3 days ago Pertinent Negatives: Patient denies other injuries, no shortness of breath, pain with breathing or bruising over left chest.   Disposition: [] ED /[] Urgent Care (no appt availability in office) / [x] Appointment(In office/virtual)/ []  [facility]/ [] Home Care/ [] Refused Recommended Disposition /[] [facility]/ []  Follow-up with PCP Additional Notes: Appt made for his routine check that he could not make today at 10:00 due to transportation service running late, for 06/02/2024 at 1:20 with Gloria Zarwolo, FNP.  Gave symptoms to watch for to call 911 related to fall 3 days ago and having left sided chest soreness and swelling.

## 2024-04-09 ENCOUNTER — Other Ambulatory Visit: Payer: Self-pay | Admitting: Family Medicine

## 2024-04-09 DIAGNOSIS — H1011 Acute atopic conjunctivitis, right eye: Secondary | ICD-10-CM

## 2024-06-02 ENCOUNTER — Ambulatory Visit: Admitting: Family Medicine

## 2024-07-08 ENCOUNTER — Encounter: Payer: Self-pay | Admitting: Radiology

## 2024-07-12 ENCOUNTER — Telehealth: Payer: Self-pay | Admitting: Family Medicine

## 2024-07-12 NOTE — Telephone Encounter (Signed)
 Copied from CRM [id number]. Topic: Clinical - Prescription Issue >> Jul 12, 2024  4:17 PM Bradley Mclaughlin wrote: Reason for CRM: Pt stated that gabapentin  (NEURONTIN ) 600 MG tablet is not effective. He is still feeling very anxious. He would like another medication and a call back at [phone]

## 2024-07-13 ENCOUNTER — Telehealth: Payer: Self-pay

## 2024-07-13 NOTE — Telephone Encounter (Signed)
 Copied from CRM [id number]. Topic: General - Other >> Jul 13, 2024  2:04 PM Marissa P wrote: Reason for CRM:  Pt stated that gabapentin  (NEURONTIN ) 600 MG tablet is not effective. He is still feeling very anxious. He would like another medication and a call back [phone]

## 2024-07-16 ENCOUNTER — Other Ambulatory Visit: Payer: Self-pay | Admitting: Family Medicine

## 2024-07-16 DIAGNOSIS — F419 Anxiety disorder, unspecified: Secondary | ICD-10-CM

## 2024-07-19 ENCOUNTER — Other Ambulatory Visit: Payer: Self-pay | Admitting: Neurology

## 2024-07-19 NOTE — Telephone Encounter (Signed)
  Very High Drug-Drug: methotrexate  and meloxicam   Wasn't sure about filling this due to interaction w/out running by the provider 1st . Thanks,  Nevelyn

## 2024-07-19 NOTE — Telephone Encounter (Signed)
 scheduled

## 2024-07-21 ENCOUNTER — Encounter (HOSPITAL_COMMUNITY): Payer: Self-pay | Admitting: Oral Surgery

## 2024-07-21 ENCOUNTER — Other Ambulatory Visit: Payer: Self-pay

## 2024-07-21 NOTE — Progress Notes (Signed)
 SDW CALL  Patient was given pre-op instructions over the phone. The opportunity was given for the patient to ask questions. No further questions asked. Patient verbalized understanding of instructions given.   PCP - Gloria Zarwolo Cardiologist - denies  PPM/ICD - denies   Chest x-ray - denies EKG - denies Stress Test - denies ECHO - denies Cardiac Cath - denies  Sleep Study - denies  No DM  Last dose of GLP1 agonist-  denies GLP1 instructions: denies  Blood Thinner Instructions: denies Aspirin Instructions:denies  ERAS Protcol -  NPO   COVID TEST- n/a   Anesthesia review: no  Patient denies shortness of breath, fever, cough and chest pain over the phone call   All instructions explained to the patient, with a verbal understanding of the material. Patient agrees to go over the instructions while at home for a better understanding.

## 2024-07-21 NOTE — H&P (Signed)
   Patient: Bradley Mclaughlin  PID: [id number]  DOB: 08-17-1966  SEX: Male   Patient referred by DDS for extraction all remaining teeth  CC: Bad teeth.  Past Medical History:  Smoker, Alcohol Abuse, Mental Health problems, Pain and Clicking of Jaw, Snoring, Hay Fever or Sinus Problems    Medications: Atorvastatin , Cymbalta , Neurontin , Xyzal , Mobic , Methotrexate     Allergies:     None    Surgeries:   muscle biopsy, Femur sx, Hand surgery     Social History       Smoking:  1 ppd          Alcohol: Drug use:                             Exam: BMI 20. Multiple carious, mobile teeth # 2, 6, 7, 9, 12, 19, 20, 21, 22, 23, 25, 29, 30.  No purulence, edema, fluctuance, trismus. Oral cancer screening negative. Pharynx clear. No lymphadenopathy.  Panorex:Multiple carious, mobile teeth # 2, 6, 7, 9, 12, 19, 20, 21, 22, 23, 25, 29, 30. Generalized bone loss.  Assessment: ASA 2. Non-restorable teeth # 2, 6, 7, 9, 12, 19, 20, 21, 22, 23, 25, 29, 30.              Plan: Extraction Teeth # 2, 6, 7, 9, 12, 19, 20, 21, 22, 23, 25, 29, 30.    Alveoloplasty.   Hospital Day surgery.                 Rx: n               Risks and complications explained. Questions answered.   Glendia EMERSON Primrose, DMD

## 2024-07-22 ENCOUNTER — Encounter (HOSPITAL_COMMUNITY): Payer: Self-pay | Admitting: Oral Surgery

## 2024-07-22 ENCOUNTER — Other Ambulatory Visit: Payer: Self-pay

## 2024-07-22 ENCOUNTER — Ambulatory Visit (HOSPITAL_COMMUNITY): Admitting: Anesthesiology

## 2024-07-22 ENCOUNTER — Ambulatory Visit (HOSPITAL_COMMUNITY)
Admission: RE | Admit: 2024-07-22 | Discharge: 2024-07-22 | Disposition: A | Discharge status: Home / Self Care | Attending: Oral Surgery | Admitting: Oral Surgery

## 2024-07-22 ENCOUNTER — Encounter (HOSPITAL_COMMUNITY)
Admission: RE | Disposition: A | Discharge status: Home / Self Care | Payer: Self-pay | Source: Home / Self Care | Attending: Oral Surgery

## 2024-07-22 DIAGNOSIS — K0850 Unsatisfactory restoration of tooth, unspecified: Secondary | ICD-10-CM | POA: Diagnosis not present

## 2024-07-22 DIAGNOSIS — K029 Dental caries, unspecified: Secondary | ICD-10-CM | POA: Insufficient documentation

## 2024-07-22 DIAGNOSIS — K056 Periodontal disease, unspecified: Secondary | ICD-10-CM | POA: Insufficient documentation

## 2024-07-22 DIAGNOSIS — K0889 Other specified disorders of teeth and supporting structures: Secondary | ICD-10-CM | POA: Diagnosis not present

## 2024-07-22 HISTORY — PX: TOOTH EXTRACTION: SHX859

## 2024-07-22 LAB — COMPREHENSIVE METABOLIC PANEL WITH GFR
ALT: 64 U/L — ABNORMAL HIGH (ref 0–44)
AST: 61 U/L — ABNORMAL HIGH (ref 15–41)
Albumin: 3.7 g/dL (ref 3.5–5.0)
Alkaline Phosphatase: 56 U/L (ref 38–126)
Anion gap: 17 — ABNORMAL HIGH (ref 5–15)
BUN: 10 mg/dL (ref 6–20)
CO2: 22 mmol/L (ref 22–32)
Calcium: 9.2 mg/dL (ref 8.9–10.3)
Chloride: 102 mmol/L (ref 98–111)
Creatinine, Ser: 0.56 mg/dL — ABNORMAL LOW (ref 0.61–1.24)
GFR, Estimated: >60 mL/min (ref >60)
Glucose, Bld: 116 mg/dL — ABNORMAL HIGH (ref 70–99)
Potassium: 4 mmol/L (ref 3.5–5.1)
Sodium: 141 mmol/L (ref 135–145)
Total Bilirubin: 0.9 mg/dL (ref 0.0–1.2)
Total Protein: 7.3 g/dL (ref 6.5–8.1)

## 2024-07-22 LAB — CBC
HCT: 49.9 % (ref 39.0–52.0)
Hemoglobin: 16.8 g/dL (ref 13.0–17.0)
MCH: 33.2 pg (ref 26.0–34.0)
MCHC: 33.7 g/dL (ref 30.0–36.0)
MCV: 98.6 fL (ref 80.0–100.0)
Platelets: 252 K/uL (ref 150–400)
RBC: 5.06 MIL/uL (ref 4.22–5.81)
RDW: 13.2 % (ref 11.5–15.5)
WBC: 8.6 K/uL (ref 4.0–10.5)
nRBC: 0 % (ref 0.0–0.2)

## 2024-07-22 SURGERY — DENTAL RESTORATION/EXTRACTIONS
Anesthesia: General | Site: Mouth

## 2024-07-22 MED ORDER — LIDOCAINE 2% (20 MG/ML) 5 ML SYRINGE
INTRAMUSCULAR | Status: AC
Start: 2024-07-22 — End: 2024-07-22
  Filled 2024-07-22: qty 5

## 2024-07-22 MED ORDER — DEXAMETHASONE SODIUM PHOSPHATE 10 MG/ML IJ SOLN
INTRAMUSCULAR | Status: DC | PRN
  Administered 2024-07-22: 5 mg via INTRAVENOUS

## 2024-07-22 MED ORDER — LIDOCAINE-EPINEPHRINE 2 %-1:100000 IJ SOLN
INTRAMUSCULAR | Status: DC | PRN
  Administered 2024-07-22: 17 mL

## 2024-07-22 MED ORDER — ROCURONIUM BROMIDE 10 MG/ML (PF) SYRINGE
PREFILLED_SYRINGE | INTRAVENOUS | Status: DC | PRN
  Administered 2024-07-22: 50 mg via INTRAVENOUS

## 2024-07-22 MED ORDER — ROCURONIUM BROMIDE 10 MG/ML (PF) SYRINGE
PREFILLED_SYRINGE | INTRAVENOUS | Status: AC
Start: 2024-07-22 — End: 2024-07-22
  Filled 2024-07-22: qty 10

## 2024-07-22 MED ORDER — 0.9 % SODIUM CHLORIDE (POUR BTL) OPTIME
TOPICAL | Status: DC | PRN
  Administered 2024-07-22: 1000 mL

## 2024-07-22 MED ORDER — AMOXICILLIN 500 MG PO CAPS: 500.0000 mg | ORAL_CAPSULE | Freq: Three times a day (TID) | ORAL | 0 refills | Status: DC

## 2024-07-22 MED ORDER — MIDAZOLAM HCL 2 MG/2ML IJ SOLN
INTRAMUSCULAR | Status: AC
Start: 2024-07-22 — End: 2024-07-22
  Filled 2024-07-22: qty 2

## 2024-07-22 MED ORDER — DEXMEDETOMIDINE HCL IN NACL 80 MCG/20ML IV SOLN
INTRAVENOUS | Status: DC | PRN
  Administered 2024-07-22: 8 ug via INTRAVENOUS
  Administered 2024-07-22: 4 ug via INTRAVENOUS
  Administered 2024-07-22: 8 ug via INTRAVENOUS

## 2024-07-22 MED ORDER — SUGAMMADEX SODIUM 200 MG/2ML IV SOLN
INTRAVENOUS | Status: DC | PRN
  Administered 2024-07-22: 200 mg via INTRAVENOUS

## 2024-07-22 MED ORDER — DEXAMETHASONE SODIUM PHOSPHATE 10 MG/ML IJ SOLN
INTRAMUSCULAR | Status: AC
Start: 2024-07-22 — End: 2024-07-22
  Filled 2024-07-22: qty 1

## 2024-07-22 MED ORDER — ORAL CARE MOUTH RINSE: 15.0000 mL | Freq: Once | OROMUCOSAL | Status: AC

## 2024-07-22 MED ORDER — LIDOCAINE 2% (20 MG/ML) 5 ML SYRINGE
INTRAMUSCULAR | Status: DC | PRN
  Administered 2024-07-22: 60 mg via INTRAVENOUS

## 2024-07-22 MED ORDER — LIDOCAINE-EPINEPHRINE 2 %-1:100000 IJ SOLN
INTRAMUSCULAR | Status: AC
  Filled 2024-07-22: qty 1

## 2024-07-22 MED ORDER — FENTANYL CITRATE (PF) 250 MCG/5ML IJ SOLN
INTRAMUSCULAR | Status: AC
  Filled 2024-07-22: qty 5

## 2024-07-22 MED ORDER — PROPOFOL 10 MG/ML IV BOLUS
INTRAVENOUS | Status: DC | PRN
  Administered 2024-07-22: 150 mg via INTRAVENOUS
  Administered 2024-07-22: 50 mg via INTRAVENOUS

## 2024-07-22 MED ORDER — CHLORHEXIDINE GLUCONATE 0.12 % MT SOLN
15.0000 mL | Freq: Once | OROMUCOSAL | Status: AC
  Administered 2024-07-22: 15 mL via OROMUCOSAL
  Filled 2024-07-22: qty 15

## 2024-07-22 MED ORDER — PROPOFOL 10 MG/ML IV BOLUS
INTRAVENOUS | Status: AC
  Filled 2024-07-22: qty 20

## 2024-07-22 MED ORDER — LACTATED RINGERS IV SOLN: INTRAVENOUS | Status: DC

## 2024-07-22 MED ORDER — OXYCODONE-ACETAMINOPHEN 5-325 MG PO TABS: 1.0000 | ORAL_TABLET | ORAL | 0 refills | Status: DC | PRN

## 2024-07-22 MED ORDER — FENTANYL CITRATE (PF) 250 MCG/5ML IJ SOLN
INTRAMUSCULAR | Status: DC | PRN
  Administered 2024-07-22 (×2): 50 ug via INTRAVENOUS

## 2024-07-22 MED ORDER — MIDAZOLAM HCL 2 MG/2ML IJ SOLN
INTRAMUSCULAR | Status: DC | PRN
  Administered 2024-07-22: 2 mg via INTRAVENOUS

## 2024-07-22 MED ORDER — ACETAMINOPHEN 500 MG PO TABS: 1000.0000 mg | ORAL_TABLET | Freq: Once | ORAL | Status: DC

## 2024-07-22 MED ORDER — PROPOFOL 10 MG/ML IV BOLUS
INTRAVENOUS | Status: AC
Start: 2024-07-22 — End: 2024-07-22
  Filled 2024-07-22: qty 20

## 2024-07-22 MED ORDER — OXYMETAZOLINE HCL 0.05 % NA SOLN
NASAL | Status: DC | PRN
Start: 2024-07-22 — End: 2024-07-22
  Administered 2024-07-22 (×2): 2 via NASAL

## 2024-07-22 MED ORDER — CEFAZOLIN SODIUM-DEXTROSE 2-4 GM/100ML-% IV SOLN
2.0000 g | INTRAVENOUS | Status: AC
  Administered 2024-07-22: 2 g via INTRAVENOUS
  Filled 2024-07-22: qty 100

## 2024-07-22 MED ORDER — OXYMETAZOLINE HCL 0.05 % NA SOLN
NASAL | Status: DC | PRN
  Administered 2024-07-22: 1

## 2024-07-22 MED ORDER — CHLORHEXIDINE GLUCONATE 0.12 % MT SOLN: 15.0000 mL | Freq: Two times a day (BID) | OROMUCOSAL | 0 refills | Status: DC

## 2024-07-22 MED ORDER — ONDANSETRON HCL 4 MG/2ML IJ SOLN
INTRAMUSCULAR | Status: DC | PRN
  Administered 2024-07-22: 4 mg via INTRAVENOUS

## 2024-07-22 MED ORDER — ONDANSETRON HCL 4 MG/2ML IJ SOLN
INTRAMUSCULAR | Status: AC
Start: 2024-07-22 — End: 2024-07-22
  Filled 2024-07-22: qty 2

## 2024-07-22 MED ORDER — SODIUM CHLORIDE 0.9 % IR SOLN
Status: DC | PRN
  Administered 2024-07-22: 1000 mL

## 2024-07-22 SURGICAL SUPPLY — 25 items
BAG COUNTER SPONGE SURGICOUNT (BAG) IMPLANT
BLADE SURG 15 STRL LF DISP TIS (BLADE) ×1 IMPLANT
BUR CROSS CUT FISSURE 1.6 (BURR) ×1 IMPLANT
BUR EGG ELITE 4.0 (BURR) IMPLANT
CANISTER SUCTION 3000ML PPV (SUCTIONS) ×1 IMPLANT
COVER SURGICAL LIGHT HANDLE (MISCELLANEOUS) ×1 IMPLANT
GAUZE PACKING FOLDED 2 STR (GAUZE/BANDAGES/DRESSINGS) ×1 IMPLANT
GLOVE BIO SURGEON STRL SZ8 (GLOVE) ×1 IMPLANT
GOWN STRL REUS W/ TWL LRG LVL3 (GOWN DISPOSABLE) ×1 IMPLANT
GOWN STRL REUS W/ TWL XL LVL3 (GOWN DISPOSABLE) ×1 IMPLANT
IV NS 1000ML BAXH (IV SOLUTION) ×1 IMPLANT
KIT BASIN OR (CUSTOM PROCEDURE TRAY) ×1 IMPLANT
KIT TURNOVER KIT B (KITS) ×1 IMPLANT
NDL HYPO 25GX1X1/2 BEV (NEEDLE) ×2 IMPLANT
NEEDLE HYPO 25GX1X1/2 BEV (NEEDLE) ×2 IMPLANT
NS IRRIG 1000ML POUR BTL (IV SOLUTION) ×1 IMPLANT
PAD ARMBOARD POSITIONER FOAM (MISCELLANEOUS) ×1 IMPLANT
SLEEVE IRRIGATION ELITE 7 (MISCELLANEOUS) ×1 IMPLANT
SUT CHROMIC 3 0 SH 27 (SUTURE) IMPLANT
SUT PLAIN 3 0 PS2 27 (SUTURE) IMPLANT
SYR BULB IRRIG 60ML STRL (SYRINGE) ×1 IMPLANT
SYR CONTROL 10ML LL (SYRINGE) ×1 IMPLANT
TRAY ENT MC OR (CUSTOM PROCEDURE TRAY) ×1 IMPLANT
TUBING IRRIGATION (MISCELLANEOUS) ×1 IMPLANT
YANKAUER SUCT BULB TIP NO VENT (SUCTIONS) ×1 IMPLANT

## 2024-07-22 NOTE — Transfer of Care (Signed)
 Immediate Anesthesia Transfer of Care Note  Patient: Bradley Mclaughlin  Procedure(s) Performed: DENTAL RESTORATION/EXTRACTIONS (Mouth)  Patient Location: PACU  Anesthesia Type:General  Level of Consciousness: awake, alert , patient cooperative, and responds to stimulation  Airway & Oxygen  Therapy: Patient Spontanous Breathing and Patient connected to face mask oxygen   Post-op Assessment: Report given to RN, Post -op Vital signs reviewed and stable, and Patient moving all extremities X 4  Post vital signs: Reviewed and stable  Last Vitals:  Vitals Value Taken Time  BP 151/88 07/22/24 10:45  Temp 36.4 C 07/22/24 10:40  Pulse 69 07/22/24 10:46  Resp 15 07/22/24 10:46  SpO2 100 % 07/22/24 10:46  Vitals shown include unfiled device data.  Last Pain:  Vitals:   07/22/24 1040  TempSrc:   PainSc: 0-No pain         Complications: No notable events documented.

## 2024-07-22 NOTE — Anesthesia Procedure Notes (Signed)
 Procedure Name: Intubation Date/Time: 07/22/2024 9:34 AM  Performed by: Jolynn Mage, CRNAPre-anesthesia Checklist: Patient identified, Emergency Drugs available, Suction available and Patient being monitored Patient Re-evaluated:Patient Re-evaluated prior to induction Oxygen  Delivery Method: Circle system utilized Preoxygenation: Pre-oxygenation with 100% oxygen  Induction Type: IV induction Ventilation: Mask ventilation without difficulty Laryngoscope Size: Mac, 4 and Glidescope Grade View: Grade I Nasal Tubes: Nasal prep performed, Nasal Rae and Right Tube size: 7.0 mm Number of attempts: 1 Airway Equipment and Method: Video-laryngoscopy Placement Confirmation: ETT inserted through vocal cords under direct vision, positive ETCO2 and breath sounds checked- equal and bilateral Tube secured with: Tape Dental Injury: Teeth and Oropharynx as per pre-operative assessment  Difficulty Due To: Difficult Airway- due to dentition

## 2024-07-22 NOTE — H&P (Signed)
 H&P documentation  -History and Physical Reviewed  -Patient has been re-examined  -No change in the plan of care  Bradley Mclaughlin

## 2024-07-22 NOTE — Anesthesia Preprocedure Evaluation (Addendum)
 Anesthesia Evaluation  Patient identified by MRN, date of birth, ID band Patient awake    Reviewed: Allergy & Precautions, NPO status , Patient's Chart, lab work & pertinent test results  History of Anesthesia Complications Negative for: history of anesthetic complications  Airway Mallampati: I  TM Distance: >3 FB Neck ROM: Full    Dental  (+) Dental Advisory Given, Missing, Chipped, Loose, Poor Dentition   Pulmonary Current Smoker and Patient abstained from smoking.   Pulmonary exam normal        Cardiovascular negative cardio ROS Normal cardiovascular exam     Neuro/Psych  PSYCHIATRIC DISORDERS   Bipolar Disorder   negative neurological ROS     GI/Hepatic negative GI ROS,,,(+)     substance abuse  marijuana use  Endo/Other  negative endocrine ROS    Renal/GU negative Renal ROS     Musculoskeletal negative musculoskeletal ROS (+)    Abdominal   Peds  Hematology negative hematology ROS (+)   Anesthesia Other Findings   Reproductive/Obstetrics                              Anesthesia Physical Anesthesia Plan  ASA: 2  Anesthesia Plan: General   Post-op Pain Management: Tylenol  PO (pre-op)*   Induction: Intravenous  PONV Risk Score and Plan: 1 and Treatment may vary due to age or medical condition, Ondansetron , Dexamethasone  and Midazolam   Airway Management Planned: Nasal ETT  Additional Equipment: None  Intra-op Plan:   Post-operative Plan: Extubation in OR  Informed Consent: I have reviewed the patients History and Physical, chart, labs and discussed the procedure including the risks, benefits and alternatives for the proposed anesthesia with the patient or authorized representative who has indicated his/her understanding and acceptance.     Dental advisory given  Plan Discussed with: CRNA and Anesthesiologist  Anesthesia Plan Comments:          Anesthesia  Quick Evaluation

## 2024-07-22 NOTE — Anesthesia Postprocedure Evaluation (Signed)
 Anesthesia Post Note  Patient: Bradley Mclaughlin  Procedure(s) Performed: DENTAL RESTORATION/EXTRACTIONS (Mouth)     Patient location during evaluation: PACU Anesthesia Type: General Level of consciousness: awake and alert Pain management: pain level controlled Vital Signs Assessment: post-procedure vital signs reviewed and stable Respiratory status: spontaneous breathing, nonlabored ventilation and respiratory function stable Cardiovascular status: stable and blood pressure returned to baseline Anesthetic complications: no   No notable events documented.  Last Vitals:  Vitals:   07/22/24 1100 07/22/24 1110  BP: (!) 153/88 (!) 155/86  Pulse: 67 61  Resp: 13 13  Temp:  36.4 C  SpO2: 99% 98%    Last Pain:  Vitals:   07/22/24 1040  TempSrc:   PainSc: 0-No pain                 Debby FORBES Like

## 2024-07-22 NOTE — Op Note (Signed)
 NAME: Bradley Mclaughlin, Bradley Mclaughlin MEDICAL RECORD NO: [id number] ACCOUNT NO: [id number] DATE OF BIRTH: [birth year] FACILITY: [facility] LOCATION: [facility] PHYSICIAN: Glendia EMERSON Primrose, DDS  Operative Report   DATE OF PROCEDURE: 07/22/2024  PREOPERATIVE DIAGNOSIS:  Nonrestorable teeth numbers 2, 6, 7, 9, 12, 19, 20, 21, 22, 23, 25, 29, and 30 secondary to dental caries and periodontal disease.  POSTOPERATIVE DIAGNOSIS: Nonrestorable teeth numbers 2, 6, 7, 9, 12, 19, 20, 21, 22, 23, 25, 29, and 30 secondary to dental caries and periodontal disease.  PROCEDURE: Extraction of teeth numbers 2, 6, 7, 9, 12, 19, 20, 21, 22, 23, 25, 29, and 30; alveoloplasty right and left maxilla and mandible.  SURGEON:  Glendia EMERSON Primrose, DDS  ANESTHESIA:  General nasal intubation.  DESCRIPTION OF PROCEDURE:  The patient was taken to the operating room.  A nasal endotracheal tube was placed and secured.  The eyes were protected.  The patient was draped for surgery.  A timeout was performed.  The posterior pharynx was suctioned and a  throat pack was placed.  2% lidocaine  1:100,000 epinephrine  was infiltrated in an inferior alveolar block on the right and left sides of the mandible and anterior infiltration in the vestibule of the mandible.  Local anesthesia was also administered  buccally and palatally in the maxilla.  A total of 17 mL was utilized at this point.  The left side was operated first.  A 15 blade was used to make an incision surrounding tooth #19 and the gingival sulcus carried forward in the buccal and lingual  sulcus to tooth #23 and across to tooth #25.  The periosteum was reflected.  The teeth were elevated and removed with a dental forceps and 301 elevator.  The tissue was trimmed to allow for closure.  The tissue was reflected.  The sockets were curetted,  debrided, and then alveoloplasty was performed using the egg burr followed by the bone file and the area was irrigated and closed with 3-0 chromic.  Then, the left  maxilla was operated.  A 15 blade was used to make an incision around tooth #12, carried  forward on the edentulous space to tooth numbers 9, 7, and 6 in the buccal and palatal sulcus.  The periosteum was reflected.  The teeth were removed with the dental forceps.  The tissue was reflected to expose the alveolar crest.  Alveoloplasty was  performed.  The sockets were curetted.  The tissue was trimmed to allow for closure and then closed with 3-0 chromic after irrigating.  Then, attention was turned to the right side.  The 15 blade was used to make an incision around tooth #2 and around  teeth numbers 29 and 30.  The teeth were elevated and removed with the dental forceps.  The sockets were curetted and debrided.  There was a buccal sharp area at 29 and 30 and so the flap was extended 1 cm proximally and distally and the tissue was  reflected and alveoloplasty was performed in this area to smooth the bone down.  Then, bone file was used to further smooth the area and then the areas were irrigated and closed with 3-0 chromic.  Then, the oral cavity was suctioned.  The throat pack was  removed and the patient was left in the care of anesthesia for extubation and transport to recovery with plans for discharge home through day surgery.  ESTIMATED BLOOD LOSS:  Minimal.  COMPLICATIONS:  None.  SPECIMENS:  None.  COUNTS:  Correct.   PUS  D: 07/22/2024 10:38:04 am T: 07/22/2024 3:04:00 pm  JOB: [id number]/ [id number]

## 2024-07-22 NOTE — H&P (Signed)
 Anesthesia H&P Update: History and Physical Exam reviewed; patient is OK for planned anesthetic and procedure. ? ?

## 2024-07-22 NOTE — Op Note (Signed)
 07/22/2024  10:33 AM  PATIENT:  Bradley Mclaughlin  58 y.o. male  PRE-OPERATIVE DIAGNOSIS:  Nonrestorable teeth #'s 2, 6, 7, 9, 12, 19, 20, 21, 22, 23, 25, 29, 30, secondary to dental caries, periodontal disease.   POST-OPERATIVE DIAGNOSIS:  SAME  PROCEDURE:  Procedure(s): DENTAL RESTORATION/EXTRACTIONS teeth #'s 2, 6, 7, 9, 12, 19, 20, 21, 22, 23, 25, 29, 30, Alveoloplasty  SURGEON:  Surgeon(s): Sheryle Hamilton, DMD  ANESTHESIA:   local and general  EBL:  minimal  DRAINS: none   SPECIMEN:  No Specimen  COUNTS:  YES  PLAN OF CARE: Discharge to home after PACU  PATIENT DISPOSITION:  PACU - hemodynamically stable.   PROCEDURE DETAILS: Dictation #[id number]  Hamilton EMERSON Sheryle, DMD 07/22/2024 10:33 AM

## 2024-07-25 ENCOUNTER — Encounter (HOSPITAL_COMMUNITY): Payer: Self-pay | Admitting: Oral Surgery

## 2024-08-01 ENCOUNTER — Ambulatory Visit: Admitting: Nurse Practitioner

## 2024-08-18 ENCOUNTER — Ambulatory Visit: Payer: Self-pay | Admitting: Family Medicine

## 2024-08-23 ENCOUNTER — Ambulatory Visit: Payer: Self-pay | Admitting: Physician Assistant

## 2024-09-19 ENCOUNTER — Encounter: Payer: Self-pay | Admitting: Radiology

## 2024-09-30 ENCOUNTER — Ambulatory Visit: Payer: Self-pay

## 2024-09-30 ENCOUNTER — Telehealth: Admitting: Physician Assistant

## 2024-09-30 DIAGNOSIS — B9689 Other specified bacterial agents as the cause of diseases classified elsewhere: Secondary | ICD-10-CM

## 2024-09-30 DIAGNOSIS — K047 Periapical abscess without sinus: Secondary | ICD-10-CM

## 2024-09-30 DIAGNOSIS — J208 Acute bronchitis due to other specified organisms: Secondary | ICD-10-CM

## 2024-09-30 MED ORDER — PROMETHAZINE-DM 6.25-15 MG/5ML PO SYRP: 5.0000 mL | ORAL_SOLUTION | Freq: Four times a day (QID) | ORAL | 0 refills | Status: AC | PRN

## 2024-09-30 MED ORDER — AZITHROMYCIN 250 MG PO TABS: ORAL_TABLET | ORAL | 0 refills | Status: AC

## 2024-09-30 MED ORDER — CHLORHEXIDINE GLUCONATE 0.12 % MT SOLN: 15.0000 mL | Freq: Two times a day (BID) | OROMUCOSAL | 0 refills | Status: AC

## 2024-09-30 MED ORDER — ALBUTEROL SULFATE HFA 108 (90 BASE) MCG/ACT IN AERS: 1.0000 | INHALATION_SPRAY | Freq: Four times a day (QID) | RESPIRATORY_TRACT | 0 refills | Status: AC | PRN

## 2024-09-30 NOTE — Telephone Encounter (Signed)
 Duplicate Contact Call.

## 2024-09-30 NOTE — Progress Notes (Signed)
 Virtual Visit Consent   [name], you are scheduled for a virtual visit with a [facility] provider today. Just as with appointments in the office, your consent must be obtained to participate. Your consent will be active for this visit and any virtual visit you may have with one of our providers in the next 365 days. If you have a MyChart account, a copy of this consent can be sent to you electronically.  As this is a virtual visit, video technology does not allow for your provider to perform a traditional examination. This may limit your provider's ability to fully assess your condition. If your provider identifies any concerns that need to be evaluated in person or the need to arrange testing (such as labs, EKG, etc.), we will make arrangements to do so. Although advances in technology are sophisticated, we cannot ensure that it will always work on either your end or our end. If the connection with a video visit is poor, the visit may have to be switched to a telephone visit. With either a video or telephone visit, we are not always able to ensure that we have a secure connection.  By engaging in this virtual visit, you consent to the provision of healthcare and authorize for your insurance to be billed (if applicable) for the services provided during this visit. Depending on your insurance coverage, you may receive a charge related to this service.  I need to obtain your verbal consent now. Are you willing to proceed with your visit today? Bradley Mclaughlin has provided verbal consent on 09/30/2024 for a virtual visit (video or telephone). Delon CHRISTELLA Dickinson, PA-C  Date: 09/30/2024 5:05 PM   Virtual Visit via Video Note   I, Delon CHRISTELLA Dickinson, connected with  Bradley Mclaughlin  ([id number], 57-Feb-1967) on 09/30/24 at  5:00 PM EST by a video-enabled telemedicine application and verified that I am speaking with the correct person using two identifiers.  Location: Patient: Virtual Visit  Location Patient: Home Provider: Virtual Visit Location Provider: Home Office   I discussed the limitations of evaluation and management by telemedicine and the availability of in person appointments. The patient expressed understanding and agreed to proceed.    History of Present Illness: Bradley Mclaughlin is a 58 y.o. who identifies as a male who was assigned male at birth, and is being seen today for cough and congestion.  HPI: Cough This is a new problem. The current episode started in the past 7 days. The cough is Non-productive. Associated symptoms include headaches, nasal congestion, rhinorrhea, a sore throat (initial symptoms) and wheezing (at nighttime). Pertinent negatives include no chills, ear congestion, ear pain, fever, myalgias, postnasal drip or shortness of breath. The symptoms are aggravated by lying down and cold air. Treatments tried: allergy medications, cough drops, tylenol . The treatment provided no relief. His past medical history is significant for environmental allergies. There is no history of asthma, bronchitis, COPD or pneumonia.     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   Muscular weakness 05/29/2022   Elevated CPK 04/30/2022   Gait abnormality 03/24/2022   Weakness of both lower extremities 03/24/2022   Right hip pain 03/24/2022    Allergies:  No Known Allergies Medications:  Current Outpatient Medications:    albuterol  (VENTOLIN  HFA) 108 (90 Base) MCG/ACT inhaler, Inhale 1-2 puffs into the lungs every 6 (six) hours as needed., Disp: 8 g, Rfl: 0   azithromycin  (ZITHROMAX ) 250 MG tablet, Take 2 tablets on day 1, then 1 tablet daily on days 2 through 5, Disp: 6  tablet, Rfl: 0   chlorhexidine  (PERIDEX ) 0.12 % solution, Use as directed 15 mLs in the mouth or throat 2 (two) times daily., Disp: 120 mL, Rfl: 0   promethazine-dextromethorphan (PROMETHAZINE-DM) 6.25-15 MG/5ML syrup, Take 5 mLs by mouth 4 (four) times daily as needed., Disp: 118 mL, Rfl: 0   atorvastatin  (LIPITOR) 20 MG tablet, Take 1 tablet (20 mg total) by mouth daily. (Patient not taking: Reported on 07/21/2024), Disp: 90 tablet, Rfl: 3   azelastine  (OPTIVAR ) 0.05 % ophthalmic solution, Place 1 drop into the right eye 2 (two) times daily. (Patient not taking: Reported on 07/21/2024), Disp: 6 mL, Rfl: 1   diphenhydrAMINE (BENADRYL) 25 MG tablet, Take 25 mg by mouth every 6 (six) hours as needed for allergies., Disp: , Rfl:    DULoxetine  (CYMBALTA ) 60 MG capsule, TAKE 1 Capsule BY MOUTH ONCE EVERY DAY, Disp: 90 capsule, Rfl: 3   folic acid  (FOLVITE ) 1 MG tablet, Take 1 tablet (1 mg total) by mouth daily., Disp: 90 tablet, Rfl: 3   gabapentin  (NEURONTIN ) 600 MG tablet, Take 1.5 tablets (900 mg total) by mouth 3 (three) times daily., Disp: 140 tablet, Rfl: 11   levocetirizine (XYZAL ) 5 MG tablet, Take 1 tablet (5 mg total) by mouth every evening., Disp: 30 tablet, Rfl: 1   meloxicam  (MOBIC ) 15 MG tablet, Take 1 tablet (15 mg total) by mouth daily., Disp: 30 tablet, Rfl: 6   methotrexate  (RHEUMATREX) 2.5 MG tablet, Take 8 tablets (20 mg total) by mouth once a week. Caution:Chemotherapy. Protect from light., Disp: 40 tablet, Rfl: 11   predniSONE  (DELTASONE ) 10 MG tablet, Take 6 tablets (60 mg total) by mouth daily with breakfast., Disp: 180 tablet, Rfl: 11   rosuvastatin  (CRESTOR ) 5 MG tablet, Take 5 mg by mouth daily., Disp: , Rfl:    Vitamin D , Ergocalciferol , (DRISDOL ) 1.25 MG (50000 UNIT) CAPS capsule, Take 1 capsule (50,000 Units total) by mouth every 7 (seven) days. For 8 weeks. Then take over the counter vitamin D  (2000 IU) daily., Disp: 20 capsule, Rfl: 0  Observations/Objective: Patient is  well-developed, well-nourished in no acute distress.  Resting comfortably at home.  Head is normocephalic, atraumatic.  No labored breathing.  Speech is clear and coherent with logical content.  Patient is alert and oriented at baseline.    Assessment and Plan: 1. Acute bacterial bronchitis (Primary) - azithromycin  (ZITHROMAX ) 250 MG tablet; Take 2 tablets on day 1, then 1 tablet daily on days 2 through 5  Dispense: 6 tablet; Refill: 0 - promethazine-dextromethorphan (PROMETHAZINE-DM) 6.25-15 MG/5ML syrup; Take 5 mLs by mouth 4 (four) times daily as needed.  Dispense: 118 mL; Refill: 0 - albuterol  (VENTOLIN  HFA) 108 (90 Base) MCG/ACT inhaler; Inhale 1-2 puffs into the lungs every 6 (six) hours as needed.  Dispense: 8 g; Refill: 0  2. Chronic dental infection - chlorhexidine  (PERIDEX ) 0.12 % solution; Use as directed 15 mLs in the mouth or throat 2 (two) times daily.  Dispense: 120 mL; Refill: 0  - Worsening over a week despite OTC medications - Will treat with Z-pack and Promethazine DM - Add Albuterol  inhaler for wheezing - Refilled Peridex  mouth  rinse at patient request - Can continue Mucinex  - Push fluids.  - Rest.  - Steam and humidifier can help - Seek in person evaluation if worsening or symptoms fail to improve    Follow Up Instructions: I discussed the assessment and treatment plan with the patient. The patient was provided an opportunity to ask questions and all were answered. The patient agreed with the plan and demonstrated an understanding of the instructions.  A copy of instructions were sent to the patient via MyChart unless otherwise noted below.    The patient was advised to call back or seek an in-person evaluation if the symptoms worsen or if the condition fails to improve as anticipated.    Delon CHRISTELLA Dickinson, PA-C

## 2024-09-30 NOTE — Telephone Encounter (Signed)
 FYI Only or Action Required?: Action required by provider: update on patient condition.  Patient was last seen in primary care on 02/24/2024 by Dettinger, Fonda LABOR, MD.  Called Nurse Triage reporting Nasal Congestion.  Symptoms began several days ago.  Interventions attempted: Nothing.  Symptoms are: unchanged.  Triage Disposition: Home Care  Patient/caregiver understands and will follow disposition?: No, wishes to speak with PCP  Message from [facility] S sent at 09/30/2024 12:27 PM EST  Reason for Triage:  sore throat, cough, no fever.   Reason for Disposition  Common cold with no complications  Answer Assessment - Initial Assessment Questions 1. ONSET: When did the nasal discharge start?      Two days ago 2. AMOUNT: How much discharge is there?      Not runny 3. COUGH: Do you have a cough? If Yes, ask: Describe the color of your mucus. (e.g., clear, white, yellow, green)     Cough, non productive 4. RESPIRATORY DISTRESS: Describe your breathing.      States that he is  wheezing, congestion 5. FEVER: Do you have a fever? If Yes, ask: What is your temperature, how was it measured, and when did it start?     denies 6. SEVERITY: Overall, how bad are you feeling right now? (e.g., doesn't interfere with normal activities, staying home from school/work, staying in bed)      Feels fine at time of call, but feels worse at night and in the morning 7. OTHER SYMPTOMS: Do you have any other symptoms? (e.g., earache, mouth sores, sore throat, wheezing)     Sore throat, congestion 8. PREGNANCY: Is there any chance you are pregnant? When was your last menstrual period?     N/a  Protocols used: Common Cold-A-AH

## 2024-09-30 NOTE — Telephone Encounter (Signed)
  NT 1st attempt, no answer, left voice mail message      Message from [facility] S sent at 09/30/2024 12:27 PM EST  Reason for Triage:  sore throat, cough, no fever.

## 2024-09-30 NOTE — Patient Instructions (Signed)
 Bradley Mclaughlin, thank you for joining Delon CHRISTELLA Dickinson, PA-C for today's virtual visit.  While this provider is not your primary care provider (PCP), if your PCP is located in our provider database this encounter information will be shared with them immediately following your visit.   A [facility] MyChart account gives you access to today's visit and all your visits, tests, and labs performed at [facility]  click here if you don't have a [facility] MyChart account or go to [email or web address]  Consent: (Patient) Bradley Mclaughlin provided verbal consent for this virtual visit at the beginning of the encounter.  Current Medications:  Current Outpatient Medications:    albuterol  (VENTOLIN  HFA) 108 (90 Base) MCG/ACT inhaler, Inhale 1-2 puffs into the lungs every 6 (six) hours as needed., Disp: 8 g, Rfl: 0   azithromycin  (ZITHROMAX ) 250 MG tablet, Take 2 tablets on day 1, then 1 tablet daily on days 2 through 5, Disp: 6 tablet, Rfl: 0   chlorhexidine  (PERIDEX ) 0.12 % solution, Use as directed 15 mLs in the mouth or throat 2 (two) times daily., Disp: 120 mL, Rfl: 0   promethazine-dextromethorphan (PROMETHAZINE-DM) 6.25-15 MG/5ML syrup, Take 5 mLs by mouth 4 (four) times daily as needed., Disp: 118 mL, Rfl: 0   atorvastatin  (LIPITOR) 20 MG tablet, Take 1 tablet (20 mg total) by mouth daily. (Patient not taking: Reported on 07/21/2024), Disp: 90 tablet, Rfl: 3   azelastine  (OPTIVAR ) 0.05 % ophthalmic solution, Place 1 drop into the right eye 2 (two) times daily. (Patient not taking: Reported on 07/21/2024), Disp: 6 mL, Rfl: 1   diphenhydrAMINE (BENADRYL) 25 MG tablet, Take 25 mg by mouth every 6 (six) hours as needed for allergies., Disp: , Rfl:    DULoxetine  (CYMBALTA ) 60 MG capsule, TAKE 1 Capsule BY MOUTH ONCE EVERY DAY, Disp: 90 capsule, Rfl: 3   folic acid  (FOLVITE ) 1 MG tablet, Take 1 tablet (1 mg total) by mouth daily., Disp: 90 tablet, Rfl: 3   gabapentin  (NEURONTIN ) 600 MG  tablet, Take 1.5 tablets (900 mg total) by mouth 3 (three) times daily., Disp: 140 tablet, Rfl: 11   levocetirizine (XYZAL ) 5 MG tablet, Take 1 tablet (5 mg total) by mouth every evening., Disp: 30 tablet, Rfl: 1   meloxicam  (MOBIC ) 15 MG tablet, Take 1 tablet (15 mg total) by mouth daily., Disp: 30 tablet, Rfl: 6   methotrexate  (RHEUMATREX) 2.5 MG tablet, Take 8 tablets (20 mg total) by mouth once a week. Caution:Chemotherapy. Protect from light., Disp: 40 tablet, Rfl: 11   predniSONE  (DELTASONE ) 10 MG tablet, Take 6 tablets (60 mg total) by mouth daily with breakfast., Disp: 180 tablet, Rfl: 11   rosuvastatin  (CRESTOR ) 5 MG tablet, Take 5 mg by mouth daily., Disp: , Rfl:    Vitamin D , Ergocalciferol , (DRISDOL ) 1.25 MG (50000 UNIT) CAPS capsule, Take 1 capsule (50,000 Units total) by mouth every 7 (seven) days. For 8 weeks. Then take over the counter vitamin D  (2000 IU) daily., Disp: 20 capsule, Rfl: 0   Medications ordered in this encounter:  Meds ordered this encounter  Medications   chlorhexidine  (PERIDEX ) 0.12 % solution    Sig: Use as directed 15 mLs in the mouth or throat 2 (two) times daily.    Dispense:  120 mL    Refill:  0    Supervising Provider:   BLAISE ALEENE KIDD [id number]   azithromycin  (ZITHROMAX ) 250 MG tablet    Sig: Take 2 tablets on day 1, then 1 tablet  daily on days 2 through 5    Dispense:  6 tablet    Refill:  0    Supervising Provider:   LAMPTEY, PHILIP O [[id number]]   promethazine-dextromethorphan (PROMETHAZINE-DM) 6.25-15 MG/5ML syrup    Sig: Take 5 mLs by mouth 4 (four) times daily as needed.    Dispense:  118 mL    Refill:  0    Supervising Provider:   LAMPTEY, PHILIP O [id number]   albuterol  (VENTOLIN  HFA) 108 (90 Base) MCG/ACT inhaler    Sig: Inhale 1-2 puffs into the lungs every 6 (six) hours as needed.    Dispense:  8 g    Refill:  0    Supervising Provider:   BLAISE ALEENE KIDD [[id number]]     *If you need refills on other medications prior to your next  appointment, please contact your pharmacy*  Follow-Up: Call back or seek an in-person evaluation if the symptoms worsen or if the condition fails to improve as anticipated.  [facility] [phone]  Other Instructions Upper Respiratory Infection, Adult An upper respiratory infection (URI) is a common viral infection of the nose, throat, and upper air passages that lead to the lungs. The most common type of URI is the common cold. URIs usually get better on their own, without medical treatment. What are the causes? A URI is caused by a virus. You may catch a virus by: Breathing in droplets from an infected person's cough or sneeze. Touching something that has been exposed to the virus (is contaminated) and then touching your mouth, nose, or eyes. What increases the risk? You are more likely to get a URI if: You are very young or very old. You have close contact with others, such as at work, school, or a health care facility. You smoke. You have long-term (chronic) heart or lung disease. You have a weakened disease-fighting system (immune system). You have nasal allergies or asthma. You are experiencing a lot of stress. You have poor nutrition. What are the signs or symptoms? A URI usually involves some of the following symptoms: Runny or stuffy (congested) nose. Cough. Sneezing. Sore throat. Headache. Fatigue. Fever. Loss of appetite. Pain in your forehead, behind your eyes, and over your cheekbones (sinus pain). Muscle aches. Redness or irritation of the eyes. Pressure in the ears or face. How is this diagnosed? This condition may be diagnosed based on your medical history and symptoms, and a physical exam. Your health care provider may use a swab to take a mucus sample from your nose (nasal swab). This sample can be tested to determine what virus is causing the illness. How is this treated? URIs usually get better on their own within 7-10 days. Medicines  cannot cure URIs, but your health care provider may recommend certain medicines to help relieve symptoms, such as: Over-the-counter cold medicines. Cough suppressants. Coughing is a type of defense against infection that helps to clear the respiratory system, so take these medicines only as recommended by your health care provider. Fever-reducing medicines. Follow these instructions at home: Activity Rest as needed. If you have a fever, stay home from work or school until your fever is gone or until your health care provider says your URI cannot spread to other people (is no longer contagious). Your health care provider may have you wear a face mask to prevent your infection from spreading. Relieving symptoms Gargle with a mixture of salt and water 3-4 times a day or as needed. To make salt water,  completely dissolve -1 tsp (3-6 g) of salt in 1 cup (237 mL) of warm water. Use a cool-mist humidifier to add moisture to the air. This can help you breathe more easily. Eating and drinking  Drink enough fluid to keep your urine pale yellow. Eat soups and other clear broths. General instructions  Take over-the-counter and prescription medicines only as told by your health care provider. These include cold medicines, fever reducers, and cough suppressants. Do not use any products that contain nicotine or tobacco. These products include cigarettes, chewing tobacco, and vaping devices, such as e-cigarettes. If you need help quitting, ask your health care provider. Stay away from secondhand smoke. Stay up to date on all immunizations, including the yearly (annual) flu vaccine. Keep all follow-up visits. This is important. How to prevent the spread of infection to others URIs can be contagious. To prevent the infection from spreading: Wash your hands with soap and water for at least 20 seconds. If soap and water are not available, use hand sanitizer. Avoid touching your mouth, face, eyes, or  nose. Cough or sneeze into a tissue or your sleeve or elbow instead of into your hand or into the air.  Contact a health care provider if: You are getting worse instead of better. You have a fever or chills. Your mucus is brown or red. You have yellow or brown discharge coming from your nose. You have pain in your face, especially when you bend forward. You have swollen neck glands. You have pain while swallowing. You have white areas in the back of your throat. Get help right away if: You have shortness of breath that gets worse. You have severe or persistent: Headache. Ear pain. Sinus pain. Chest pain. You have chronic lung disease along with any of the following: Making high-pitched whistling sounds when you breathe, most often when you breathe out (wheezing). Prolonged cough (more than 14 days). Coughing up blood. A change in your usual mucus. You have a stiff neck. You have changes in your: Vision. Hearing. Thinking. Mood. These symptoms may be an emergency. Get help right away. Call 911. Do not wait to see if the symptoms will go away. Do not drive yourself to the hospital. Summary An upper respiratory infection (URI) is a common infection of the nose, throat, and upper air passages that lead to the lungs. A URI is caused by a virus. URIs usually get better on their own within 7-10 days. Medicines cannot cure URIs, but your health care provider may recommend certain medicines to help relieve symptoms. This information is not intended to replace advice given to you by your health care provider. Make sure you discuss any questions you have with your health care provider. Document Revised: 06/05/2021 Document Reviewed: 06/05/2021 Elsevier Patient Education  2024 Elsevier Inc.   If you have been instructed to have an in-person evaluation today at a local Urgent Care facility, please use the link below. It will take you to a list of all of our available [facility], including address, phone number and hours of operation. Please do not delay care.  [facility]  If you or a family member do not have a primary care provider, use the link below to schedule a visit and establish care. When you choose a [facility] primary care physician or advanced practice provider, you gain a long-term partner in health. Find a Primary Care Provider  Learn more about 's in-office and virtual care options: [facility] - Get Care Now

## 2024-09-30 NOTE — Telephone Encounter (Signed)
 Appt made.

## 2024-10-27 ENCOUNTER — Telehealth: Admitting: Neurology

## 2024-10-27 DIAGNOSIS — Z79899 Other long term (current) drug therapy: Secondary | ICD-10-CM | POA: Diagnosis not present

## 2024-10-27 DIAGNOSIS — M25551 Pain in right hip: Secondary | ICD-10-CM | POA: Diagnosis not present

## 2024-10-27 DIAGNOSIS — G7249 Other inflammatory and immune myopathies, not elsewhere classified: Secondary | ICD-10-CM | POA: Diagnosis not present

## 2024-10-27 DIAGNOSIS — R748 Abnormal levels of other serum enzymes: Secondary | ICD-10-CM | POA: Diagnosis not present

## 2024-10-27 DIAGNOSIS — R269 Unspecified abnormalities of gait and mobility: Secondary | ICD-10-CM | POA: Diagnosis not present
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
## 2024-11-02 ENCOUNTER — Encounter (HOSPITAL_COMMUNITY): Payer: Self-pay | Admitting: Emergency Medicine

## 2024-11-02 ENCOUNTER — Emergency Department (HOSPITAL_COMMUNITY)
Admission: EM | Admit: 2024-11-02 | Discharge: 2024-11-02 | Disposition: A | Discharge status: Home / Self Care | Attending: Emergency Medicine | Admitting: Emergency Medicine

## 2024-11-02 ENCOUNTER — Other Ambulatory Visit: Payer: Self-pay

## 2024-11-02 DIAGNOSIS — M7022 Olecranon bursitis, left elbow: Secondary | ICD-10-CM | POA: Insufficient documentation

## 2024-11-02 DIAGNOSIS — Y9389 Activity, other specified: Secondary | ICD-10-CM | POA: Diagnosis not present

## 2024-11-02 DIAGNOSIS — L02414 Cutaneous abscess of left upper limb: Secondary | ICD-10-CM | POA: Diagnosis present

## 2024-11-02 NOTE — ED Notes (Signed)
 [facility] coming to pick up pt and take home in approximately 30-40 minutes. Pt to wait in lobby for them.

## 2024-11-02 NOTE — ED Triage Notes (Signed)
 Pt BIB [facility] from home c/o abscess to left elbow. States he noticed it last night but the swelling has increased tonight.   ETOH on board.

## 2024-11-02 NOTE — ED Notes (Signed)
 Pt has a golf ball sized fluid filled knot to left elbow. NO drainage, red center. Pt reports he has had several packs of beer tonight. Kellogg RN

## 2024-11-02 NOTE — ED Notes (Signed)
 Ambulated to restroom with cane. Bertina Solum, RN

## 2024-11-02 NOTE — ED Provider Notes (Signed)
 [place] EMERGENCY DEPARTMENT AT [facility] Provider Note   CSN: [id number] Arrival date & time: 11/02/24  0005     Patient presents with: Abscess   Bradley Mclaughlin is a 58 y.o. male.   The history is provided by the patient.   Patient with history of bipolar disorder presents with left elbow swelling. No falls or trauma.  No fevers.  Has never had any surgery to that arm.  He denies IV substance use He is concerned he may have an abscess He admits to alcohol use   Past Medical History:  Diagnosis Date   Bipolar depression (HCC)    Cirrhosis of liver (HCC)    no evidence of cirrhosis on imaging   Myositis     Prior to Admission medications  Medication Sig Start Date End Date Taking? Authorizing Provider  albuterol  (VENTOLIN  HFA) 108 (90 Base) MCG/ACT inhaler Inhale 1-2 puffs into the lungs every 6 (six) hours as needed. 09/30/24   Vivienne Delon HERO, PA-C  atorvastatin  (LIPITOR) 20 MG tablet Take 1 tablet (20 mg total) by mouth daily. Patient not taking: Reported on 07/21/2024 12/13/23   Edman Meade PEDLAR, FNP  azelastine  (OPTIVAR ) 0.05 % ophthalmic solution Place 1 drop into the right eye 2 (two) times daily. Patient not taking: Reported on 07/21/2024 04/12/24   Edman Meade PEDLAR, FNP  chlorhexidine  (PERIDEX ) 0.12 % solution Use as directed 15 mLs in the mouth or throat 2 (two) times daily. 09/30/24   Vivienne Delon HERO, PA-C  diphenhydrAMINE (BENADRYL) 25 MG tablet Take 25 mg by mouth every 6 (six) hours as needed for allergies.    [provider]  DULoxetine  (CYMBALTA ) 60 MG capsule TAKE 1 Capsule BY MOUTH ONCE EVERY DAY 12/08/23   Onita Duos, MD  folic acid  (FOLVITE ) 1 MG tablet Take 1 tablet (1 mg total) by mouth daily. 12/08/23   Onita Duos, MD  gabapentin  (NEURONTIN ) 600 MG tablet Take 1.5 tablets (900 mg total) by mouth 3 (three) times daily. 12/08/23   Yan, Yijun, MD  levocetirizine (XYZAL ) 5 MG tablet Take 1 tablet (5 mg total) by mouth every  evening. 02/01/24   Bacchus, Meade PEDLAR, FNP  meloxicam  (MOBIC ) 15 MG tablet Take 1 tablet (15 mg total) by mouth daily. 07/19/24   Onita Duos, MD  methotrexate  (RHEUMATREX) 2.5 MG tablet Take 8 tablets (20 mg total) by mouth once a week. Caution:Chemotherapy. Protect from light. 12/02/23   Onita Duos, MD  predniSONE  (DELTASONE ) 10 MG tablet Take 6 tablets (60 mg total) by mouth daily with breakfast. 12/02/23   Onita Duos, MD  promethazine -dextromethorphan (PROMETHAZINE -DM) 6.25-15 MG/5ML syrup Take 5 mLs by mouth 4 (four) times daily as needed. 09/30/24   Vivienne Delon HERO, PA-C  rosuvastatin  (CRESTOR ) 5 MG tablet Take 5 mg by mouth daily. 03/29/24   [provider]  Vitamin D , Ergocalciferol , (DRISDOL ) 1.25 MG (50000 UNIT) CAPS capsule Take 1 capsule (50,000 Units total) by mouth every 7 (seven) days. For 8 weeks. Then take over the counter vitamin D  (2000 IU) daily. 12/04/23   Bacchus, Gloria Z, FNP    Allergies: Patient has no known allergies.    Review of Systems  Constitutional:  Negative for fever.  Gastrointestinal:  Negative for vomiting.    Updated Vital Signs BP 97/74   Pulse 77   Temp 97.6 F (36.4 C) (Oral)   Resp 16   Ht 1.702 m (5' 7)   Wt 51.4 kg   SpO2 96%  BMI 17.75 kg/m   Physical Exam CONSTITUTIONAL: Disheveled, no acute distress HEAD: Normocephalic/atraumatic NEURO: Pt is awake/alert/appropriate, moves all extremitiesx4.  No facial droop.   EXTREMITIES: pulses normal/equal, full ROM Left olecranon bursitis noted.  Minimal tenderness to palpation.  Minimal erythema.  No induration Full range of motion of left elbow.  Distal pulses intact.  No track marks noted SKIN: warm, color normal PSYCH: no abnormalities of mood noted, alert and oriented to situation  (all labs ordered are listed, but only abnormal results are displayed) Labs Reviewed - No data to display  EKG: None  Radiology: No results found.   Procedures   Medications Ordered in the  ED - No data to display  Clinical Course as of 11/02/24 0427  Wed Nov 02, 2024  0426 Patient presents with isolated swelling of the left elbow consistent olecranon bursitis.  There is no signs of any abscess.  He has full range of motion of the left elbow.  I have low suspicion for septic arthritis or abscess.  Will defer aspiration at this time as I have low suspicion for infection.  Will provide compression, advised OTC meds and follow-up with orthopedics [DW]    Clinical Course User Index [DW] Midge Golas, MD                                 Medical Decision Making       Final diagnoses:  Olecranon bursitis of left elbow    ED Discharge Orders     None          Midge Golas, MD 11/02/24 [phone]

## 2024-12-12 ENCOUNTER — Telehealth (INDEPENDENT_AMBULATORY_CARE_PROVIDER_SITE_OTHER): Admitting: Family Medicine

## 2024-12-12 DIAGNOSIS — F419 Anxiety disorder, unspecified: Secondary | ICD-10-CM | POA: Diagnosis not present

## 2024-12-12 MED ORDER — SERTRALINE HCL 25 MG PO TABS: 25.0000 mg | ORAL_TABLET | Freq: Every day | ORAL | 3 refills | Status: AC

## 2024-12-12 MED ORDER — HYDROXYZINE PAMOATE 25 MG PO CAPS: 25.0000 mg | ORAL_CAPSULE | Freq: Three times a day (TID) | ORAL | 1 refills | Status: AC | PRN

## 2024-12-12 NOTE — Progress Notes (Signed)
 "  Virtual Visit via Video Note  I connected with Bradley Mclaughlin on 12/12/24 at  1:40 PM EST by a video enabled telemedicine application and verified that I am speaking with the correct person using two identifiers.  Patient Location: Home Provider Location: Home Office  I discussed the limitations, risks, security, and privacy concerns of performing an evaluation and management service by video and the availability of in person appointments. I also discussed with the patient that there may be a patient responsible charge related to this service. The patient expressed understanding and agreed to proceed.  Subjective: PCP: Edman Meade PEDLAR, FNP  Chief Complaint  Patient presents with   Anxiety    Has been having a lot of issues with his nerves. Gets irritable very easily and starts yelling at people and really upset easily   HPI   The patient presents today reporting increasing difficulty managing his nerves and mood. He describes becoming easily irritable and frequently upset, stating that he sometimes yells at others and feels overwhelmed by minor stressors. He has a history of schizophrenia. The patient admits to taking a friends Xanax on one occasion, noting that it helped calm his symptoms. He previously received care at [facility] but reports that he stopped attending therapy and discontinued follow-up with his provider because he was prescribed trazodone with minimal relief and felt that his concerns were not being adequately addressed or heard.  ROS: Per HPI Current Medications[1]  Observations/Objective: There were no vitals filed for this visit. Physical Exam Patient is well-developed, well-nourished in no acute distress.  Resting comfortably at home.  Head is normocephalic, atraumatic.  No labored breathing.  Speech is clear and coherent with logical content.  Patient is alert and oriented at baseline.   Assessment and Plan: Anxiety Assessment & Plan: Reviewed the  potential side effects and risks of Xanax, including sedation, dizziness, impaired coordination, dependence, and withdrawal, and informed the patient that benzodiazepines are not considered first-line therapy for anxiety. Will initiate sertraline  (Zoloft ) 25 mg daily and hydroxyzine  25 mg every 8 hours as needed for severe anxiety. A referral was placed to psychiatry for collaborative care and further medication management. The patient was also encouraged to implement lifestyle modifications, including stress reduction techniques, regular physical activity, adequate sleep, and avoidance of alcohol or other sedating substances.    Orders: -     Sertraline  HCl; Take 1 tablet (25 mg total) by mouth daily.  Dispense: 30 tablet; Refill: 3 -     hydrOXYzine  Pamoate; Take 1 capsule (25 mg total) by mouth every 8 (eight) hours as needed.  Dispense: 30 capsule; Refill: 1 -     Ambulatory referral to Psychiatry  Note: This chart has been completed using [employer] (consulting) software, and while attempts have been made to ensure accuracy, certain words and phrases may not be transcribed as intended.    Follow Up Instructions: No follow-ups on file.   I discussed the assessment and treatment plan with the patient. The patient was provided an opportunity to ask questions, and all were answered. The patient agreed with the plan and demonstrated an understanding of the instructions.   The patient was advised to call back or seek an in-person evaluation if the symptoms worsen or if the condition fails to improve as anticipated.  The above assessment and management plan was discussed with the patient. The patient verbalized understanding of and has agreed to the management plan.   Meade PEDLAR Edman, FNP     [  1]  Current Outpatient Medications:    hydrOXYzine  (VISTARIL ) 25 MG capsule, Take 1 capsule (25 mg total) by mouth every 8 (eight) hours as needed., Disp: 30 capsule, Rfl: 1   sertraline  (ZOLOFT ) 25  MG tablet, Take 1 tablet (25 mg total) by mouth daily., Disp: 30 tablet, Rfl: 3   albuterol  (VENTOLIN  HFA) 108 (90 Base) MCG/ACT inhaler, Inhale 1-2 puffs into the lungs every 6 (six) hours as needed., Disp: 8 g, Rfl: 0   atorvastatin  (LIPITOR) 20 MG tablet, Take 1 tablet (20 mg total) by mouth daily. (Patient not taking: Reported on 07/21/2024), Disp: 90 tablet, Rfl: 3   azelastine  (OPTIVAR ) 0.05 % ophthalmic solution, Place 1 drop into the right eye 2 (two) times daily. (Patient not taking: Reported on 07/21/2024), Disp: 6 mL, Rfl: 1   chlorhexidine  (PERIDEX ) 0.12 % solution, Use as directed 15 mLs in the mouth or throat 2 (two) times daily., Disp: 120 mL, Rfl: 0   DULoxetine  (CYMBALTA ) 60 MG capsule, TAKE 1 Capsule BY MOUTH ONCE EVERY DAY, Disp: 90 capsule, Rfl: 3   folic acid  (FOLVITE ) 1 MG tablet, Take 1 tablet (1 mg total) by mouth daily., Disp: 90 tablet, Rfl: 3   gabapentin  (NEURONTIN ) 600 MG tablet, Take 1.5 tablets (900 mg total) by mouth 3 (three) times daily., Disp: 140 tablet, Rfl: 11   levocetirizine (XYZAL ) 5 MG tablet, Take 1 tablet (5 mg total) by mouth every evening., Disp: 30 tablet, Rfl: 1   meloxicam  (MOBIC ) 15 MG tablet, Take 1 tablet (15 mg total) by mouth daily., Disp: 30 tablet, Rfl: 6   methotrexate  (RHEUMATREX) 2.5 MG tablet, Take 8 tablets (20 mg total) by mouth once a week. Caution:Chemotherapy. Protect from light., Disp: 40 tablet, Rfl: 11   predniSONE  (DELTASONE ) 10 MG tablet, Take 6 tablets (60 mg total) by mouth daily with breakfast., Disp: 180 tablet, Rfl: 11   promethazine -dextromethorphan (PROMETHAZINE -DM) 6.25-15 MG/5ML syrup, Take 5 mLs by mouth 4 (four) times daily as needed., Disp: 118 mL, Rfl: 0   rosuvastatin  (CRESTOR ) 5 MG tablet, Take 5 mg by mouth daily., Disp: , Rfl:    Vitamin D , Ergocalciferol , (DRISDOL ) 1.25 MG (50000 UNIT) CAPS capsule, Take 1 capsule (50,000 Units total) by mouth every 7 (seven) days. For 8 weeks. Then take over the counter vitamin D  (2000  IU) daily., Disp: 20 capsule, Rfl: 0  "

## 2024-12-12 NOTE — Assessment & Plan Note (Signed)
 Reviewed the potential side effects and risks of Xanax, including sedation, dizziness, impaired coordination, dependence, and withdrawal, and informed the patient that benzodiazepines are not considered first-line therapy for anxiety. Will initiate sertraline  (Zoloft ) 25 mg daily and hydroxyzine  25 mg every 8 hours as needed for severe anxiety. A referral was placed to psychiatry for collaborative care and further medication management. The patient was also encouraged to implement lifestyle modifications, including stress reduction techniques, regular physical activity, adequate sleep, and avoidance of alcohol or other sedating substances.

## 2024-12-19 ENCOUNTER — Ambulatory Visit: Payer: Self-pay | Admitting: Family Medicine

## 2025-02-02 ENCOUNTER — Ambulatory Visit: Admitting: Neurology
